# Patient Record
Sex: Male | Born: 1975 | Race: Black or African American | Hispanic: No | Marital: Married | Smoking: Former smoker
Health system: Southern US, Community
[De-identification: ages and names within clinical notes are randomized; demographics above are authoritative.]

## PROBLEM LIST (undated history)

## (undated) ENCOUNTER — Ambulatory Visit (HOSPITAL_COMMUNITY): Admission: EM | Payer: Self-pay

## (undated) ENCOUNTER — Ambulatory Visit (HOSPITAL_COMMUNITY): Admission: EM | Payer: Self-pay | Source: Home / Self Care

## (undated) DIAGNOSIS — E785 Hyperlipidemia, unspecified: Secondary | ICD-10-CM

## (undated) DIAGNOSIS — I10 Essential (primary) hypertension: Secondary | ICD-10-CM

## (undated) HISTORY — DX: Hyperlipidemia, unspecified: E78.5

---

## 2008-06-13 DIAGNOSIS — I10 Essential (primary) hypertension: Secondary | ICD-10-CM

## 2008-06-13 DIAGNOSIS — E785 Hyperlipidemia, unspecified: Secondary | ICD-10-CM | POA: Insufficient documentation

## 2008-06-13 DIAGNOSIS — E1142 Type 2 diabetes mellitus with diabetic polyneuropathy: Secondary | ICD-10-CM | POA: Insufficient documentation

## 2008-06-13 HISTORY — DX: Essential (primary) hypertension: I10

## 2011-10-13 ENCOUNTER — Emergency Department (HOSPITAL_COMMUNITY)
Admission: EM | Admit: 2011-10-13 | Discharge: 2011-10-13 | Disposition: A | Discharge status: Home / Self Care | Payer: Self-pay | Attending: Emergency Medicine | Admitting: Emergency Medicine

## 2011-10-13 ENCOUNTER — Encounter (HOSPITAL_COMMUNITY): Payer: Self-pay | Admitting: Emergency Medicine

## 2011-10-13 DIAGNOSIS — R599 Enlarged lymph nodes, unspecified: Secondary | ICD-10-CM | POA: Insufficient documentation

## 2011-10-13 DIAGNOSIS — F17200 Nicotine dependence, unspecified, uncomplicated: Secondary | ICD-10-CM | POA: Insufficient documentation

## 2011-10-13 DIAGNOSIS — E119 Type 2 diabetes mellitus without complications: Secondary | ICD-10-CM | POA: Insufficient documentation

## 2011-10-13 DIAGNOSIS — I10 Essential (primary) hypertension: Secondary | ICD-10-CM | POA: Insufficient documentation

## 2011-10-13 DIAGNOSIS — R591 Generalized enlarged lymph nodes: Secondary | ICD-10-CM

## 2011-10-13 HISTORY — DX: Essential (primary) hypertension: I10

## 2011-10-13 MED ORDER — CEPHALEXIN 500 MG PO CAPS: 500.0000 mg | ORAL_CAPSULE | Freq: Four times a day (QID) | ORAL | Status: AC

## 2011-10-13 NOTE — Discharge Instructions (Signed)
Lymphadenopathy Lymphadenopathy means "disease of the lymph glands." But the term is usually used to describe swollen or enlarged lymph glands, also called lymph nodes. These are the bean-shaped organs found in many locations including the neck, underarm, and groin. Lymph glands are part of the immune system, which fights infections in your body. Lymphadenopathy can occur in just one area of the body, such as the neck, or it can be generalized, with lymph node enlargement in several areas. The nodes found in the neck are the most common sites of lymphadenopathy. CAUSES  When your immune system responds to germs (such as viruses or bacteria ), infection-fighting cells and fluid build up. This causes the glands to grow in size. This is usually not something to worry about. Sometimes, the glands themselves can become infected and inflamed. This is called lymphadenitis. Enlarged lymph nodes can be caused by many diseases:  Bacterial disease, such as strep throat or a skin infection.   Viral disease, such as a common cold.   Other germs, such as lyme disease, tuberculosis, or sexually transmitted diseases.   Cancers, such as lymphoma (cancer of the lymphatic system) or leukemia (cancer of the white blood cells).   Inflammatory diseases such as lupus or rheumatoid arthritis.   Reactions to medications.  Many of the diseases above are rare, but important. This is why you should see your caregiver if you have lymphadenopathy. SYMPTOMS   Swollen, enlarged lumps in the neck, back of the head or other locations.   Tenderness.   Warmth or redness of the skin over the lymph nodes.   Fever.  DIAGNOSIS  Enlarged lymph nodes are often near the source of infection. They can help healthcare providers diagnose your illness. For instance:   Swollen lymph nodes around the jaw might be caused by an infection in the mouth.   Enlarged glands in the neck often signal a throat infection.   Lymph nodes that  are swollen in more than one area often indicate an illness caused by a virus.  Your caregiver most likely will know what is causing your lymphadenopathy after listening to your history and examining you. Blood tests, x-rays or other tests may be needed. If the cause of the enlarged lymph node cannot be found, and it does not go away by itself, then a biopsy may be needed. Your caregiver will discuss this with you. TREATMENT  Treatment for your enlarged lymph nodes will depend on the cause. Many times the nodes will shrink to normal size by themselves, with no treatment. Antibiotics or other medicines may be needed for infection. Only take over-the-counter or prescription medicines for pain, discomfort or fever as directed by your caregiver. HOME CARE INSTRUCTIONS  Swollen lymph glands usually return to normal when the underlying medical condition goes away. If they persist, contact your health-care provider. He/she might prescribe antibiotics or other treatments, depending on the diagnosis. Take any medications exactly as prescribed. Keep any follow-up appointments made to check on the condition of your enlarged nodes.  SEEK MEDICAL CARE IF:   Swelling lasts for more than two weeks.   You have symptoms such as weight loss, night sweats, fatigue or fever that does not go away.   The lymph nodes are hard, seem fixed to the skin or are growing rapidly.   Skin over the lymph nodes is red and inflamed. This could mean there is an infection.  SEEK IMMEDIATE MEDICAL CARE IF:   Fluid starts leaking from the area of the   enlarged lymph node.   You develop a fever of 102 F (38.9 C) or greater.   Severe pain develops (not necessarily at the site of a large lymph node).   You develop chest pain or shortness of breath.   You develop worsening abdominal pain.  MAKE SURE YOU:   Understand these instructions.   Will watch your condition.   Will get help right away if you are not doing well or get  worse.  Document Released: 03/08/2008 Document Revised: 05/19/2011 Document Reviewed: 03/08/2008 ExitCare Patient Information 2012 [employer]. 

## 2011-10-13 NOTE — ED Notes (Signed)
Pt sts hard knot in right groin area x 2 days; pt sts painful to palpation

## 2011-10-13 NOTE — ED Provider Notes (Addendum)
History  Scribed for Hilario Quarry, MD, the patient was seen in room STRE7/STRE7. This chart was scribed by Candelaria Stagers. The patient's care started at 11:37AM    CSN: [id number]  Arrival date & time 10/13/11  4098   First MD Initiated Contact with Patient 10/13/11 1111      Chief Complaint  Patient presents with  . Abscess     HPI Adam Moyer is a 36 y.o. male who presents to the Emergency Department complaining of of a knot in his right groin that he noticed two days ago.  Pt states that the knot is not red or hot nor has it changed size.  He denies fever, chills, diarrhea, dysuria, or increased frequency.  He has h/o diabetes and HTN.   Past Medical History  Diagnosis Date  . Hypertension   . Diabetes mellitus     History reviewed. No pertinent past surgical history.  History reviewed. No pertinent family history.  History  Substance Use Topics  . Smoking status: Current Everyday Smoker  . Smokeless tobacco: Not on file  . Alcohol Use: Yes     occasional      Review of Systems  Gastrointestinal: Negative for nausea, vomiting and diarrhea.  Genitourinary: Negative for dysuria.       Non tender well defined knot in the right groin area, 2x3cm in size.    All other systems reviewed and are negative.    Allergies  Review of patient's allergies indicates no known allergies.  Home Medications   Current Outpatient Rx  Name Route Sig Dispense Refill  . LISINOPRIL-HYDROCHLOROTHIAZIDE 20-12.5 MG PO TABS Oral Take 1 tablet by mouth daily.    Marland Kitchen METFORMIN HCL 500 MG PO TABS Oral Take 500 mg by mouth 2 (two) times daily with a meal.      BP 121/68  Pulse 82  Temp(Src) 97.9 F (36.6 C) (Oral)  Resp 20  SpO2 98%  Physical Exam  Nursing note and vitals reviewed. Constitutional: He is oriented to person, place, and time. He appears well-developed and well-nourished. No distress.  HENT:  Head: Normocephalic and atraumatic.  Genitourinary:       Non tender  well defined knot in the right groin area, 2cmx3cm in size.   Neurological: He is alert and oriented to person, place, and time.  Skin: He is not diaphoretic.  Psychiatric: He has a normal mood and affect. His behavior is normal.    ED Course  Procedures  DIAGNOSTIC STUDIES: Oxygen Saturation is 98% on room air, normal by my interpretation.    COORDINATION OF CARE:   I personally performed the services described in this documentation, which was scribed in my presence. The recorded information has been reviewed and considered.   Labs Reviewed - No data to display No results found.   No diagnosis found.    Patient placed on keflex and advised of need for close follow up Patient given lymph node swelling information and referral to [facility].         Hilario Quarry, MD 10/13/11 1191  Hilario Quarry, MD 10/13/11 [phone]

## 2011-11-11 ENCOUNTER — Emergency Department (HOSPITAL_COMMUNITY)
Admission: EM | Admit: 2011-11-11 | Discharge: 2011-11-11 | Disposition: A | Discharge status: Home / Self Care | Payer: Self-pay | Attending: Emergency Medicine | Admitting: Emergency Medicine

## 2011-11-11 ENCOUNTER — Encounter (HOSPITAL_COMMUNITY): Payer: Self-pay | Admitting: Emergency Medicine

## 2011-11-11 DIAGNOSIS — I10 Essential (primary) hypertension: Secondary | ICD-10-CM | POA: Insufficient documentation

## 2011-11-11 DIAGNOSIS — E119 Type 2 diabetes mellitus without complications: Secondary | ICD-10-CM | POA: Insufficient documentation

## 2011-11-11 DIAGNOSIS — Z760 Encounter for issue of repeat prescription: Secondary | ICD-10-CM | POA: Insufficient documentation

## 2011-11-11 DIAGNOSIS — F17200 Nicotine dependence, unspecified, uncomplicated: Secondary | ICD-10-CM | POA: Insufficient documentation

## 2011-11-11 LAB — GLUCOSE, CAPILLARY: Glucose-Capillary: 241 mg/dL — ABNORMAL HIGH (ref 70–99)

## 2011-11-11 MED ORDER — LISINOPRIL-HYDROCHLOROTHIAZIDE 20-12.5 MG PO TABS: 1.0000 | ORAL_TABLET | Freq: Every day | ORAL | Status: DC

## 2011-11-11 MED ORDER — METFORMIN HCL 500 MG PO TABS: 500.0000 mg | ORAL_TABLET | Freq: Two times a day (BID) | ORAL | Status: DC

## 2011-11-11 NOTE — Discharge Instructions (Signed)
Medication Refill, Emergency Department We have refilled your medication today as a courtesy to you. It is best for your medical care, however, to take care of getting refills done through your primary caregiver's office. They have your records and can do a better job of follow-up than we can in the emergency department. On maintenance medications, we often only prescribe enough medications to get you by until you are able to see your regular caregiver. This is a more expensive way to refill medications. In the future, please plan for refills so that you will not have to use the emergency department for this. Thank you for your help. Your help allows [place] to better take care of the daily emergencies that enter our department. Document Released: 09/16/2003 Document Revised: 05/19/2011 Document Reviewed: 05/30/2005 [facility] Patient Information 2012 [place], [place].RESOURCE GUIDE  Chronic Pain Problems: Contact [facility]  [phone] Patients need to be referred by their primary care doctor.  Insufficient Money for Medicine: Contact United Way:  call "211" or [facility] [phone].  No Primary Care Doctor: - Call [facility]  [phone] - can help you locate a primary care doctor that  accepts your insurance, provides certain services, etc. - Physician Referral Service- [phone]  Agencies that provide inexpensive medical care: - [facility]  [phone] - [facility]  [phone] - [facility]  [phone] - Women's Clinic  [phone] - Planned Parenthood  [phone] [facility]  [phone]  Medicaid-accepting [facility] Providers: - [facility]- [address]  [phone], Mon-Fri 9am-7pm, Sat 9am-1pm - [facility]- [address] [place], [address] [place]  [phone] - [facility]- [address], [address] [place]  [phone] [facility]  Family Medicine- [address]  [phone] - Renaye Rakers- [address] [place], [address], [phone]  Only accepts [place] patients after they have their name  applied to their card  Self Pay (no insurance) in [place]: - Sickle Cell Patients: Dr Willey Blade, [facility]  [address] [place], [phone] - [facility]- [address] [place]  [phone]       [facility] [place]- [address]       -     [facility]- see information above (Speak to [employer] if you do not have insurance)       -  [facility]- [address] [place], [phone]       -  [facility]- [address],  [phone]       -  [facility]- [address] [phone]       -  Dr Julio Sicks-  [address], [place], [phone]       -  [facility]- [address], [phone]       -  [facility]- [address] [phone], also [address] [phone]       -    [facility]- [address] [place], [phone], 1st & 3rd Saturday   every month, 10am-1pm  1) Find a Doctor and Pay Out of Pocket Although you won't have to find out who is covered by your insurance plan, it is a good idea to ask around and get recommendations.  You will then need to call the office and see if the doctor you have chosen will accept you as a new patient and what types of options they offer for patients who are self-pay. Some doctors offer discounts or will set up payment plans for their patients who do not have insurance, but you will need to ask so you aren't surprised when you get to your appointment.  2) Contact Your Local Health Department Not all health departments have doctors that can see patients for sick visits, but many do, so it is worth a call to see if yours does. If you don't know where your local health department is, you can check in your phone book. The CDC also has a  tool to help you locate your state's health department, and many state websites also have listings of all of their local health departments.  3) Find a Walk-in Clinic If your illness is not likely to be very severe or complicated, you may want to try a walk in clinic. These are popping up all over the country in pharmacies, drugstores, and shopping centers. They're usually staffed by nurse practitioners or physician assistants that have been trained to treat common illnesses and complaints. They're usually fairly quick and inexpensive. However, if you have serious medical issues or chronic medical problems, these are probably not your best option  STD Testing - [facility] Department of [facility], STD Clinic, [address] [place], phone [phone] or [phone].  Monday - Friday, call for an appointment. [facility] Department of [employer], STD Clinic, [place] [address], [place], phone [phone] or [phone].  Monday - Friday, call for an appointment.  Abuse/Neglect: [facility] Child Abuse Hotline 563-791-2658 [facility] Child Abuse Hotline 670-128-3148 (After Hours)  Emergency Shelter:  [facility] [phone]  Maternity Homes: - [facility] 701-504-8813 - [facility] (760)[phone]  MRSA Hotline #:   (301)603-7665  [facility] Resources  [facility]  United Way [facility] Dept. [address]                 [address]         [address] [place] [address]  Blondell Reveal Phone:  [phone]                                  Phone:  [phone]                   Phone:  [phone]  [facility], [phone] - [phone]       -     [facility] in  [employer], [address],  [phone], [facility] Child Abuse Hotline (726) 807-5085 or (859)134-5397 (After Hours)   Behavioral Health Services  Substance Abuse Resources: - Alcohol and Drug Services  662-551-1668 - [facility] [phone] - [facility] [phone] Floydene Flock 6693073472 - Residential & Outpatient Substance Abuse Program  [phone]  Psychological Services: [facility]  [phone] [facility] [place] [facility] Services  [phone] - [facility], [phone] [place]. [address] [place], ACCESS LINE: [phone] or [phone], [email or web address]  Dental Assistance  If unable to pay or uninsured, contact:  [facility] or [facility]. to become qualified for the adult dental clinic.  Patients with Medicaid: [facility] [phone] [address], [phone] [address] [phone]  If unable to pay, or uninsured, contact [facility] (646)[phone]) or [facility] Department [phone] in [place], [phone] in [facility]) to become qualified for the adult dental clinic  Other Low-Cost Community Dental Services: - [facility]- [address] [place], [place], [place], [address], [phone], [phone], 2nd and 4th Thursday of the month at 6:30am.  10 clients each day by appointment, can sometimes see walk-in patients if someone does not show for an appointment. [facility]- [address] [place], [place], [address], [phone] - [facility]- [address] [place], [place], [address], [phone] - [facility]- [phone] [facility] Health Department- [phone] [facility] [phone] -

## 2011-11-11 NOTE — ED Provider Notes (Signed)
History     CSN: [id number]  Arrival date & time 11/11/11  0920   First MD Initiated Contact with Patient 11/11/11 [phone]    HPI The patient presents to the emergency room with complaints of medication refill. Patient has history of diabetes and high blood pressure. He states he ran out of his medications and does not have a primary care Dr. Patient denies any other complaints. He was previously seen in the emergency room for some swelling in his groin area but that has resolved without filling his prescriptions. Patient denies any vomiting, fever, chest pain, polydipsia or polyuria.  Past Medical History  Diagnosis Date  . Hypertension   . Diabetes mellitus     History reviewed. No pertinent past surgical history.  No family history on file.  History  Substance Use Topics  . Smoking status: Current Everyday Smoker  . Smokeless tobacco: Not on file  . Alcohol Use: Yes     occasional      Review of Systems  All other systems reviewed and are negative.    Allergies  Review of patient's allergies indicates no known allergies.  Home Medications   Current Outpatient Rx  Name Route Sig Dispense Refill  . LISINOPRIL-HYDROCHLOROTHIAZIDE 20-12.5 MG PO TABS Oral Take 1 tablet by mouth daily.    Marland Kitchen METFORMIN HCL 500 MG PO TABS Oral Take 500 mg by mouth 2 (two) times daily with a meal.      BP 132/66  Pulse 91  Temp(Src) 98.6 F (37 C) (Oral)  Resp 18  SpO2 100%  Physical Exam  Nursing note and vitals reviewed. Constitutional: He appears well-developed and well-nourished. No distress.  HENT:  Head: Normocephalic and atraumatic.  Right Ear: External ear normal.  Left Ear: External ear normal.  Eyes: Conjunctivae are normal. Right eye exhibits no discharge. Left eye exhibits no discharge. No scleral icterus.  Neck: Neck supple. No tracheal deviation present.  Cardiovascular: Normal rate, regular rhythm and intact distal pulses.   Pulmonary/Chest: Effort normal and breath  sounds normal. No stridor. No respiratory distress. He has no wheezes. He has no rales.  Abdominal: He exhibits distension.  Musculoskeletal: He exhibits no edema.  Neurological: He is alert. He has normal strength. No sensory deficit. Cranial nerve deficit:  no gross defecits noted. He exhibits normal muscle tone. He displays no seizure activity. Coordination normal.  Skin: Skin is warm and dry. No rash noted.  Psychiatric: He has a normal mood and affect.    ED Course  Procedures (including critical care time)  Labs Reviewed - No data to display No results found.   1. Encounter for medication refill       MDM  I prescribed refills of the patient's blood pressure and diabetes medications. He was given the resource guide that provided him a list of primary care doctors in the area.  Next point patient the importance of seeing a primary care doctor to manage his blood pressure and diabetes. At this time there does not appear to be any evidence of an acute emergency medical condition and the patient appears stable for discharge with appropriate outpatient follow up.        Celene Kras, MD 11/11/11 1002

## 2011-11-11 NOTE — ED Notes (Signed)
Pt reports he is out of blood pressure and diabetes meds and needs new prescriptions. CBG 241 in triage.

## 2012-12-13 ENCOUNTER — Emergency Department (HOSPITAL_COMMUNITY)
Admission: EM | Admit: 2012-12-13 | Discharge: 2012-12-13 | Disposition: A | Discharge status: Home / Self Care | Payer: Self-pay | Attending: Emergency Medicine | Admitting: Emergency Medicine

## 2012-12-13 ENCOUNTER — Encounter (HOSPITAL_COMMUNITY): Payer: Self-pay | Admitting: *Deleted

## 2012-12-13 DIAGNOSIS — M62838 Other muscle spasm: Secondary | ICD-10-CM | POA: Insufficient documentation

## 2012-12-13 DIAGNOSIS — F17200 Nicotine dependence, unspecified, uncomplicated: Secondary | ICD-10-CM | POA: Insufficient documentation

## 2012-12-13 DIAGNOSIS — M542 Cervicalgia: Secondary | ICD-10-CM

## 2012-12-13 DIAGNOSIS — E119 Type 2 diabetes mellitus without complications: Secondary | ICD-10-CM | POA: Insufficient documentation

## 2012-12-13 DIAGNOSIS — I10 Essential (primary) hypertension: Secondary | ICD-10-CM | POA: Insufficient documentation

## 2012-12-13 DIAGNOSIS — M436 Torticollis: Secondary | ICD-10-CM | POA: Insufficient documentation

## 2012-12-13 DIAGNOSIS — Z79899 Other long term (current) drug therapy: Secondary | ICD-10-CM | POA: Insufficient documentation

## 2012-12-13 MED ORDER — CYCLOBENZAPRINE HCL 10 MG PO TABS
10.0000 mg | ORAL_TABLET | Freq: Once | ORAL | Status: AC
  Administered 2012-12-13: 10 mg via ORAL
  Filled 2012-12-13: qty 1

## 2012-12-13 MED ORDER — PREDNISONE 20 MG PO TABS: 40.0000 mg | ORAL_TABLET | Freq: Every day | ORAL | Status: DC

## 2012-12-13 MED ORDER — CYCLOBENZAPRINE HCL 10 MG PO TABS: 10.0000 mg | ORAL_TABLET | Freq: Two times a day (BID) | ORAL | Status: DC | PRN

## 2012-12-13 MED ORDER — PREDNISONE 20 MG PO TABS
60.0000 mg | ORAL_TABLET | Freq: Once | ORAL | Status: AC
  Administered 2012-12-13: 60 mg via ORAL
  Filled 2012-12-13: qty 3

## 2012-12-13 NOTE — ED Notes (Signed)
Pt reports 1 week hx of neck stiffness and pain radiating to right shoulder. Reports intermittent numbness and tingling. Pt alert, oriented x4, NAD at present.

## 2012-12-13 NOTE — ED Provider Notes (Signed)
History    CSN: [id number] Arrival date & time 12/13/12  0856  First MD Initiated Contact with Patient 12/13/12 0901     Chief Complaint  Patient presents with  . Torticollis  . Shoulder Pain   (Consider location/radiation/quality/duration/timing/severity/associated sxs/prior Treatment) HPI  Patient is a 37 year old male past medical history significant for hypertension and diabetes mellitus presented to the emergency department for one week of worsening right-sided neck pain with intermittent radiation down the right arm. Patient describes his pain as tight and cramping. Patient states he gets a sharp shooting pain with some numbness and tingling down the arm with painful. Rates his pain currently at 7/10. Patient states he has tried icy hot and NSAIDs without relief. Aggravating factors include turning his head. Patient denies any trauma or injury to his neck or shoulder. Denies any fever or chills.   Past Medical History  Diagnosis Date  . Hypertension   . Diabetes mellitus    History reviewed. No pertinent past surgical history. No family history on file. History  Substance Use Topics  . Smoking status: Current Every Day Smoker  . Smokeless tobacco: Not on file  . Alcohol Use: Yes     Comment: occasional    Review of Systems  Constitutional: Negative for fever and chills.  HENT: Positive for neck pain and neck stiffness. Negative for ear pain and facial swelling.   Respiratory: Negative for shortness of breath.   Cardiovascular: Negative for chest pain.  Gastrointestinal: Negative for nausea, vomiting and diarrhea.  Neurological: Negative for headaches.  All other systems reviewed and are negative.    Allergies  Review of patient's allergies indicates no known allergies.  Home Medications   Current Outpatient Rx  Name  Route  Sig  Dispense  Refill  . cyclobenzaprine (FLEXERIL) 10 MG tablet   Oral   Take 1 tablet (10 mg total) by mouth 2 (two) times daily as  needed for muscle spasms.   20 tablet   0   . lisinopril-hydrochlorothiazide (PRINZIDE,ZESTORETIC) 20-12.5 MG per tablet   Oral   Take 1 tablet by mouth daily.   30 tablet   1   . metFORMIN (GLUCOPHAGE) 500 MG tablet   Oral   Take 1 tablet (500 mg total) by mouth 2 (two) times daily with a meal.   60 tablet   1   . predniSONE (DELTASONE) 20 MG tablet   Oral   Take 2 tablets (40 mg total) by mouth daily.   10 tablet   0    BP 130/73  Pulse 78  Temp(Src) 98.1 F (36.7 C) (Oral)  Resp 16  SpO2 100% Physical Exam  Constitutional: He is oriented to person, place, and time. He appears well-developed and well-nourished. No distress.  HENT:  Head: Normocephalic and atraumatic.  Eyes: Conjunctivae are normal.  Neck: Trachea normal and normal range of motion. Neck supple. No spinous process tenderness present. No rigidity. No edema and no erythema present.  Musculoskeletal:       Right shoulder: He exhibits tenderness and spasm. He exhibits normal range of motion, no bony tenderness, no swelling, no effusion, no crepitus, no deformity, no laceration, no pain, normal pulse and normal strength.       Left shoulder: Normal.       Right elbow: Normal.      Left elbow: Normal.       Right wrist: Normal.       Left wrist: Normal.  Cervical back: He exhibits tenderness and spasm. He exhibits no bony tenderness, no swelling, no edema and no deformity.       Back:       Right upper arm: Normal.       Right hand: Normal.  Full ROM during Apley scratch test  Neurological: He is alert and oriented to person, place, and time. No cranial nerve deficit or sensory deficit. GCS eye subscore is 4. GCS verbal subscore is 5. GCS motor subscore is 6.  Skin: Skin is warm and dry. He is not diaphoretic.  Psychiatric: He has a normal mood and affect.    ED Course  Procedures (including critical care time) Labs Reviewed - No data to display No results found. 1. Neck pain on right side     2. Muscle spasm     MDM  PE shows no instability, tenderness, or deformity of acromioclavicular and sternoclavicular joints, the cervical spine, glenohumeral joint, coracoid process, acromion, or scapula. Good shoulder strength. Good ROM during scratch test. No signs of impingement. No C-spine tenderness. Full range motion in. Muscle spasm appreciated on right side of shoulder. Pain reproducible with palpation. Neurovascularly intact. Patient will be DC'd home with symptomatic care. Advised to obtain PCP for followup. Return precautions discussed. Patient stable at time of discharge.  Jeannetta Ellis, PA-C 12/13/12 1208

## 2012-12-14 NOTE — ED Provider Notes (Signed)
Medical screening examination/treatment/procedure(s) were performed by non-physician practitioner and as supervising physician I was immediately available for consultation/collaboration.    Christopher J. Pollina, MD 12/14/12 2303 

## 2013-05-15 ENCOUNTER — Encounter (HOSPITAL_COMMUNITY): Payer: Self-pay | Admitting: Emergency Medicine

## 2013-05-15 ENCOUNTER — Emergency Department (HOSPITAL_COMMUNITY)
Admission: EM | Admit: 2013-05-15 | Discharge: 2013-05-15 | Disposition: A | Discharge status: Home / Self Care | Payer: Self-pay | Attending: Emergency Medicine | Admitting: Emergency Medicine

## 2013-05-15 DIAGNOSIS — R51 Headache: Secondary | ICD-10-CM | POA: Insufficient documentation

## 2013-05-15 DIAGNOSIS — Z87891 Personal history of nicotine dependence: Secondary | ICD-10-CM | POA: Insufficient documentation

## 2013-05-15 DIAGNOSIS — E119 Type 2 diabetes mellitus without complications: Secondary | ICD-10-CM | POA: Insufficient documentation

## 2013-05-15 DIAGNOSIS — R5381 Other malaise: Secondary | ICD-10-CM | POA: Insufficient documentation

## 2013-05-15 DIAGNOSIS — R42 Dizziness and giddiness: Secondary | ICD-10-CM | POA: Insufficient documentation

## 2013-05-15 DIAGNOSIS — I10 Essential (primary) hypertension: Secondary | ICD-10-CM | POA: Insufficient documentation

## 2013-05-15 DIAGNOSIS — Z79899 Other long term (current) drug therapy: Secondary | ICD-10-CM | POA: Insufficient documentation

## 2013-05-15 DIAGNOSIS — R61 Generalized hyperhidrosis: Secondary | ICD-10-CM | POA: Insufficient documentation

## 2013-05-15 DIAGNOSIS — R509 Fever, unspecified: Secondary | ICD-10-CM | POA: Insufficient documentation

## 2013-05-15 LAB — BASIC METABOLIC PANEL
BUN: 13 mg/dL (ref 6–23)
CO2: 25 mEq/L (ref 19–32)
Chloride: 100 mEq/L (ref 96–112)
Glucose, Bld: 127 mg/dL — ABNORMAL HIGH (ref 70–99)
Potassium: 3.8 mEq/L (ref 3.5–5.1)

## 2013-05-15 LAB — CBC WITH DIFFERENTIAL/PLATELET
HCT: 41.4 % (ref 39.0–52.0)
Hemoglobin: 13.9 g/dL (ref 13.0–17.0)
Lymphs Abs: 2.5 10*3/uL (ref 0.7–4.0)
MCH: 26.4 pg (ref 26.0–34.0)
Monocytes Relative: 6 % (ref 3–12)
Neutro Abs: 4.8 10*3/uL (ref 1.7–7.7)
Neutrophils Relative %: 60 % (ref 43–77)
RBC: 5.26 MIL/uL (ref 4.22–5.81)

## 2013-05-15 LAB — GLUCOSE, CAPILLARY: Glucose-Capillary: 138 mg/dL — ABNORMAL HIGH (ref 70–99)

## 2013-05-15 MED ORDER — SODIUM CHLORIDE 0.9 % IV BOLUS (SEPSIS)
1000.0000 mL | Freq: Once | INTRAVENOUS | Status: AC
  Administered 2013-05-15: 1000 mL via INTRAVENOUS

## 2013-05-15 MED ORDER — ONDANSETRON HCL 4 MG/2ML IJ SOLN
4.0000 mg | Freq: Once | INTRAMUSCULAR | Status: AC
  Administered 2013-05-15: 4 mg via INTRAVENOUS
  Filled 2013-05-15: qty 2

## 2013-05-15 MED ORDER — PROMETHAZINE HCL 25 MG RE SUPP: 25.0000 mg | Freq: Four times a day (QID) | RECTAL | Status: DC | PRN

## 2013-05-15 MED ORDER — BUTALBITAL-APAP-CAFFEINE 50-325-40 MG PO TABS: 1.0000 | ORAL_TABLET | Freq: Four times a day (QID) | ORAL | Status: DC | PRN

## 2013-05-15 MED ORDER — ONDANSETRON HCL 4 MG PO TABS: 4.0000 mg | ORAL_TABLET | Freq: Four times a day (QID) | ORAL | Status: DC

## 2013-05-15 MED ORDER — KETOROLAC TROMETHAMINE 30 MG/ML IJ SOLN
30.0000 mg | Freq: Once | INTRAMUSCULAR | Status: AC
  Administered 2013-05-15: 30 mg via INTRAVENOUS
  Filled 2013-05-15: qty 1

## 2013-05-15 MED ORDER — PROMETHAZINE HCL 25 MG/ML IJ SOLN: 12.5000 mg | Freq: Once | INTRAMUSCULAR | Status: DC

## 2013-05-15 NOTE — ED Notes (Signed)
Pt states that he woke up this morning with headache and dizziness. Pt also states that he been sweating since around 2am. Pt took his blood sugar around 1000 was 186 then 193. Pt took his metformin and lisinopril around 8am. Pt states he ate a sausage biscuit this morning. Pt denies n/v or fever.

## 2013-05-15 NOTE — ED Provider Notes (Signed)
Medical screening examination/treatment/procedure(s) were conducted as a shared visit with non-physician practitioner(s) or resident  and myself.  I personally evaluated the patient during the encounter and agree with the findings and plan unless otherwise indicated.    I have personally reviewed any xrays and/ or EKG's with the provider and I agree with interpretation.   HA, body aches, subjective fever and lightheaded since this am. Minimal po intake today.  HA similar to previous, gradually worsening.  No travel recently.  No sick contacts.  No cp or sob.  Sxs resolved on my exam. No cardiac hx.  Mild nausea.  Fluids and labs in ED.  5+ strength in UE and LE with f/e at major joints. Fup outpt discussed.  Sensation to palpation intact in UE and LE. Supple neck/ full rom, no meningismus. CNs 2-12 grossly intact.  EOMFI.  PERRL.   Finger nose and coordination intact bilateral.   Visual fields intact to finger testing. Mild dry mm.  Smiling, well appearing on recheck. Strict reasons to return given.  HA, lightheaded, hyperglycemia, htn  Enid Skeens, MD 05/19/13 1043

## 2013-05-15 NOTE — ED Provider Notes (Signed)
CSN: [id number]     Arrival date & time 05/15/13  1258 History   None    Chief Complaint  Patient presents with  . Headache  . Dizziness  . Excessive Sweating   (Consider location/radiation/quality/duration/timing/severity/associated sxs/prior Treatment) HPI Comments: The patient is a 37 year-old male with a past medical history of DM, HTN, presenting the Emergency Department with a chief complaint of headache.  He reports waking up with a headache.  He reports associated lightheadedness without syncope.  He reports  the lightheadedness is aggravated by standing.  He reports subjective fever last night with sweating.  He reports he has only had one cup of coffee today for total oral intake.   He states his home blood glucose was 190 earlier today and is usually is around 120. He reports no change in blood pressure and monitors it at home. He reports compliance with HTN and DM medications.   Patient is a 37 y.o. male presenting with headaches. The history is provided by the patient and a significant other. No language interpreter was used.  Headache Associated symptoms: fatigue, fever and nausea   Associated symptoms: no abdominal pain, no congestion, no cough, no diarrhea, no ear pain, no hearing loss, no neck pain, no neck stiffness and no vomiting     Past Medical History  Diagnosis Date  . Hypertension   . Diabetes mellitus    History reviewed. No pertinent past surgical history. No family history on file. History  Substance Use Topics  . Smoking status: Former Smoker    Quit date: 10/11/2012  . Smokeless tobacco: Not on file  . Alcohol Use: Yes     Comment: occasional    Review of Systems  Constitutional: Positive for fever, chills, diaphoresis and fatigue. Negative for appetite change and unexpected weight change.  HENT: Negative for congestion, ear pain, hearing loss and rhinorrhea.   Eyes: Negative for visual disturbance.  Respiratory: Negative for cough, shortness of  breath and wheezing.   Cardiovascular: Negative for chest pain.  Gastrointestinal: Positive for nausea. Negative for vomiting, abdominal pain, diarrhea, constipation and blood in stool.  Genitourinary: Negative for dysuria, hematuria and difficulty urinating.  Musculoskeletal: Negative for neck pain and neck stiffness.  Skin: Negative for rash.  Neurological: Positive for light-headedness and headaches. Negative for syncope and facial asymmetry.  All other systems reviewed and are negative.    Allergies  Review of patient's allergies indicates no known allergies.  Home Medications   Current Outpatient Rx  Name  Route  Sig  Dispense  Refill  . hydrochlorothiazide (HYDRODIURIL) 25 MG tablet   Oral   Take 25 mg by mouth daily.         Marland Kitchen lisinopril-hydrochlorothiazide (PRINZIDE,ZESTORETIC) 20-12.5 MG per tablet   Oral   Take 1 tablet by mouth daily.   30 tablet   1   . metFORMIN (GLUCOPHAGE) 500 MG tablet   Oral   Take 1 tablet (500 mg total) by mouth 2 (two) times daily with a meal.   60 tablet   1   . butalbital-acetaminophen-caffeine (FIORICET) 50-325-40 MG per tablet   Oral   Take 1-2 tablets by mouth every 6 (six) hours as needed for headache.   20 tablet   0   . ondansetron (ZOFRAN) 4 MG tablet   Oral   Take 1 tablet (4 mg total) by mouth every 6 (six) hours.   12 tablet   0   . promethazine (PHENERGAN) 25 MG suppository  Rectal   Place 1 suppository (25 mg total) rectally every 6 (six) hours as needed for nausea or vomiting.   12 each   0    BP 123/75  Pulse 72  Temp(Src) 97.7 F (36.5 C) (Oral)  Resp 18  SpO2 99% Physical Exam  Nursing note and vitals reviewed. Constitutional: He is oriented to person, place, and time. Vital signs are normal. He appears well-developed and well-nourished. He is active. No distress.  HENT:  Head: Normocephalic and atraumatic.  Right Ear: Tympanic membrane normal. Tympanic membrane is not injected and not bulging.   Left Ear: Tympanic membrane normal. Tympanic membrane is not injected and not bulging.  Mouth/Throat: Uvula is midline and mucous membranes are normal. Mucous membranes are not dry. Posterior oropharyngeal erythema present. No oropharyngeal exudate or posterior oropharyngeal edema.  Eyes: EOM are normal. Pupils are equal, round, and reactive to light. No scleral icterus.  Neck: Neck supple.  Cardiovascular: Normal rate, regular rhythm and normal heart sounds.   No murmur heard. Pulmonary/Chest: Effort normal and breath sounds normal. He has no wheezes.  Abdominal: Soft. Bowel sounds are normal. There is no tenderness. There is no rebound and no guarding.  Musculoskeletal: Normal range of motion. He exhibits no edema.  Lymphadenopathy:       Head (right side): No submental, no submandibular, no tonsillar and no occipital adenopathy present.       Head (left side): No submental, no submandibular, no tonsillar and no occipital adenopathy present.       Right cervical: No superficial cervical and no posterior cervical adenopathy present.      Left cervical: No superficial cervical and no posterior cervical adenopathy present.  Neurological: He is alert and oriented to person, place, and time. He is not disoriented. He displays no tremor. No cranial nerve deficit or sensory deficit. Coordination normal.  Skin: Skin is warm and dry. No rash noted. He is not diaphoretic.  Psychiatric: He has a normal mood and affect.    ED Course  Procedures (including critical care time) Labs Review Labs Reviewed  GLUCOSE, CAPILLARY - Abnormal; Notable for the following:    Glucose-Capillary 138 (*)    All other components within normal limits  BASIC METABOLIC PANEL - Abnormal; Notable for the following:    Sodium 134 (*)    Glucose, Bld 127 (*)    GFR calc non Af Amer 88 (*)    All other components within normal limits  CBC WITH DIFFERENTIAL   Imaging Review No results found.  EKG Interpretation    None       MDM   1. Headache    Patient reports weakness, headache, dizziness and subjective fever.  No meningeal signs on exam.  Possible headache with viral etiology given history will evaluate for other causes of headache. Labs sent, medication ordered, fluids ordered.   Re-eval patient reports symptoms improving and tolerating fluids in the ED. Discussed patient history, condition, and labs with Dr. Gardiner Rhyme who agrees after his evaluation of the patient can be evaluated as an out-pt. Discussed lab results, imaging results, and treatment plan with the patient.  He reports understanding and no other concerns at this time.   Patient is stable for discharge at this time. Meds given in ED:  Medications  sodium chloride 0.9 % bolus 1,000 mL (1,000 mLs Intravenous New Bag/Given 05/15/13 1546)  ketorolac (TORADOL) 30 MG/ML injection 30 mg (30 mg Intravenous Given 05/15/13 1546)  ondansetron (ZOFRAN) injection 4 mg (4  mg Intravenous Given 05/15/13 1546)    Discharge Medication List as of 05/15/2013  4:37 PM    START taking these medications   Details  butalbital-acetaminophen-caffeine (FIORICET) 50-325-40 MG per tablet Take 1-2 tablets by mouth every 6 (six) hours as needed for headache., Starting 05/15/2013, Until Thu 05/15/14, Print    ondansetron (ZOFRAN) 4 MG tablet Take 1 tablet (4 mg total) by mouth every 6 (six) hours., Starting 05/15/2013, Until Discontinued, Print    promethazine (PHENERGAN) 25 MG suppository Place 1 suppository (25 mg total) rectally every 6 (six) hours as needed for nausea or vomiting., Starting 05/15/2013, Until Discontinued, Print               Clabe Seal, PA-C 05/19/13 1428

## 2013-05-15 NOTE — Progress Notes (Signed)
[facility] provided pt with list of primary care resources, a [facility] [employer], information about [facility].

## 2013-05-22 NOTE — ED Provider Notes (Signed)
See my separate note. Adam Moyer   Adam Skeens, MD 05/22/13 2011

## 2013-08-29 ENCOUNTER — Emergency Department (HOSPITAL_COMMUNITY)
Admission: EM | Admit: 2013-08-29 | Discharge: 2013-08-30 | Disposition: A | Discharge status: Home / Self Care | Payer: Self-pay | Attending: Emergency Medicine | Admitting: Emergency Medicine

## 2013-08-29 ENCOUNTER — Encounter (HOSPITAL_COMMUNITY): Payer: Self-pay | Admitting: Emergency Medicine

## 2013-08-29 ENCOUNTER — Other Ambulatory Visit (HOSPITAL_COMMUNITY): Payer: Self-pay

## 2013-08-29 DIAGNOSIS — R509 Fever, unspecified: Secondary | ICD-10-CM | POA: Insufficient documentation

## 2013-08-29 DIAGNOSIS — K047 Periapical abscess without sinus: Secondary | ICD-10-CM | POA: Insufficient documentation

## 2013-08-29 DIAGNOSIS — I10 Essential (primary) hypertension: Secondary | ICD-10-CM | POA: Insufficient documentation

## 2013-08-29 DIAGNOSIS — Z87891 Personal history of nicotine dependence: Secondary | ICD-10-CM | POA: Insufficient documentation

## 2013-08-29 DIAGNOSIS — E119 Type 2 diabetes mellitus without complications: Secondary | ICD-10-CM | POA: Insufficient documentation

## 2013-08-29 DIAGNOSIS — Z79899 Other long term (current) drug therapy: Secondary | ICD-10-CM | POA: Insufficient documentation

## 2013-08-29 DIAGNOSIS — R61 Generalized hyperhidrosis: Secondary | ICD-10-CM | POA: Insufficient documentation

## 2013-08-29 MED ORDER — OXYCODONE-ACETAMINOPHEN 5-325 MG PO TABS
1.0000 | ORAL_TABLET | Freq: Once | ORAL | Status: AC
  Administered 2013-08-29: 1 via ORAL
  Filled 2013-08-29: qty 1

## 2013-08-29 MED ORDER — PENICILLIN V POTASSIUM 500 MG PO TABS
500.0000 mg | ORAL_TABLET | Freq: Once | ORAL | Status: AC
  Administered 2013-08-29: 500 mg via ORAL
  Filled 2013-08-29: qty 1

## 2013-08-29 NOTE — ED Provider Notes (Signed)
CSN: [id number]     Arrival date & time 08/29/13  2230 History   First MD Initiated Contact with Patient 08/29/13 2328     Chief Complaint  Patient presents with  . Dental Pain     (Consider location/radiation/quality/duration/timing/severity/associated sxs/prior Treatment) HPI  38 year old male with history of non-insulin-dependent diabetes, hypertension presents with complaint of dental pain. Patient reports gradual onset of pain to his right lower tooth ongoing for the past 3 days. Describe pain as a sharp achy stabbing sensation, persistent, worsening with chewing, cold air or cold water.  Pain is now radiates from jaw down to neck. He also complaining of subjective fevers, chills, diaphoresis. He is afraid to eat due to the pain and report trouble swallowing. He has tried taking Tylenol, ibuprofen, oral gel, and most over the counter medication with minimal relief. He does not have a dentist and does not have insurance. His pain is currently 10 out of 10. He denies any recent trauma, or rash.    Past Medical History  Diagnosis Date  . Hypertension   . Diabetes mellitus    History reviewed. No pertinent past surgical history. History reviewed. No pertinent family history. History  Substance Use Topics  . Smoking status: Former Smoker    Quit date: 10/11/2012  . Smokeless tobacco: Not on file  . Alcohol Use: Yes     Comment: occasional    Review of Systems  Constitutional: Positive for fever, chills and appetite change.  HENT: Positive for dental problem. Negative for ear pain, mouth sores, sore throat, trouble swallowing and voice change.   Respiratory: Negative for shortness of breath.   Cardiovascular: Negative for chest pain.  Gastrointestinal: Negative for abdominal pain.  Skin: Negative for rash.      Allergies  Review of patient's allergies indicates no known allergies.  Home Medications   Current Outpatient Rx  Name  Route  Sig  Dispense  Refill  .  butalbital-acetaminophen-caffeine (FIORICET) 50-325-40 MG per tablet   Oral   Take 1-2 tablets by mouth every 6 (six) hours as needed for headache.   20 tablet   0   . hydrochlorothiazide (HYDRODIURIL) 25 MG tablet   Oral   Take 25 mg by mouth daily.         Marland Kitchen lisinopril-hydrochlorothiazide (PRINZIDE,ZESTORETIC) 20-12.5 MG per tablet   Oral   Take 1 tablet by mouth daily.   30 tablet   1   . metFORMIN (GLUCOPHAGE) 500 MG tablet   Oral   Take 1 tablet (500 mg total) by mouth 2 (two) times daily with a meal.   60 tablet   1   . ondansetron (ZOFRAN) 4 MG tablet   Oral   Take 1 tablet (4 mg total) by mouth every 6 (six) hours.   12 tablet   0   . promethazine (PHENERGAN) 25 MG suppository   Rectal   Place 1 suppository (25 mg total) rectally every 6 (six) hours as needed for nausea or vomiting.   12 each   0    BP 133/85  Pulse 75  Temp(Src) 99.9 F (37.7 C) (Oral)  Resp 14  SpO2 96% Physical Exam  Constitutional: He appears well-developed and well-nourished. No distress.  HENT:  Head: Atraumatic.  Right Ear: External ear normal.  Left Ear: External ear normal.  Mouth: mild trismus.  signficant dental decay to R lower molar (teeth #32 and #31), ttp.  No obvious abscess amenable for drainage.  No obvious signs of deep  tissue infection.  Tolerates secretion.    Eyes: Conjunctivae are normal.  Neck: Normal range of motion. Neck supple. No tracheal deviation present.  Tenderness to R lateral neck without overlying skin changes, swelling, or obvious abscess.    Cardiovascular: Normal rate and regular rhythm.   Pulmonary/Chest: Effort normal.  Abdominal: Soft. There is no tenderness.  Lymphadenopathy:    He has cervical adenopathy.  Neurological: He is alert.  Skin: No rash noted.  Psychiatric: He has a normal mood and affect.    ED Course  NERVE BLOCK Date/Time: 08/30/2013 1:11 AM Performed by: Fayrene HelperRAN, Bannon Giammarco Authorized by: Fayrene HelperRAN, Hillman Attig Consent: Verbal consent  obtained. Risks and benefits: risks, benefits and alternatives were discussed Consent given by: patient Patient understanding: patient states understanding of the procedure being performed Imaging studies: imaging studies available Patient identity confirmed: verbally with patient and arm band Time out: Immediately prior to procedure a "time out" was called to verify the correct patient, procedure, equipment, support staff and site/side marked as required. Indications: pain relief Body area: face/mouth Nerve: inferior alveolar Laterality: right Patient sedated: no Patient position: sitting Needle gauge: 27 G Location technique: anatomical landmarks Local anesthetic: bupivacaine 0.5% without epinephrine Anesthetic total: 1.8 ml Outcome: pain improved Patient tolerance: Patient tolerated the procedure well with no immediate complications.   (including critical care time)  11:52 PM Pt here with worsening dental pain, with mild trismus and c/o R neck pain.  Likely periapical abscess, however since pt c/o trouble swallowing and having mild trismus will obtain maxillofacial CT to r/o deep tissue infection.  Pain medication and abx given.    12:46 AM Care discussed with oncoming provider and with Dr. Hyacinth MeekerMiller who will dispo pt pending CT result.    12:58 AM Maxillofacial CT with dental disease.  Periapical abscess cannot be excluded.  However, no soft tissue abscess.  Will d/c with pain medication and abx with dental f/u.  Pt request for dental block.  I was able to performed an infra-aveolar block with relief of symptom.      Labs Review Labs Reviewed  I-STAT CREATININE, ED   Imaging Review Ct Maxillofacial W/cm  08/30/2013   CLINICAL DATA:  Dental pain.  EXAM: CT MAXILLOFACIAL WITH CONTRAST  TECHNIQUE: Multidetector CT imaging of the maxillofacial structures was performed with intravenous contrast. Multiplanar CT image reconstructions were also generated. A small metallic BB was  placed on the right temple in order to reliably differentiate right from left.  CONTRAST:  100mL OMNIPAQUE IOHEXOL 300 MG/ML  SOLN  COMPARISON:  None.  FINDINGS: Mild ethmoidal soft tissue thickening is noted. Mucous retention cyst left maxillary sinus. Frontal sphenoid sinuses are clear. Orbits are unremarkable. Nasopharynx is clear. Salivary glands are unremarkable. Base of tongue unremarkable. There until impair friend 0 regions are normal. Visualized laryngeal region is unremarkable. No evidence of abscess. Retropharyngeal space normal. No acute bony abnormality. Multiple lucencies are noted about the lower molars particular on the right. Exit changes are consistent with dental caries. Periapical abscess cannot be excluded. No soft tissue abscess.  IMPRESSION: Dental disease as described above.  No soft tissue abscess.   Electronically Signed   By: Maisie Fushomas  Register   On: 08/30/2013 00:52     EKG Interpretation None      MDM   Final diagnoses:  Periapical abscess    BP 133/85  Pulse 75  Temp(Src) 99.9 F (37.7 C) (Oral)  Resp 14  SpO2 96%  I have reviewed nursing notes and vital signs.  I personally reviewed the imaging tests through PACS system  I reviewed available ER/hospitalization records thought the EMR     Fayrene Helper, PA-C 08/30/13 0059  Fayrene Helper, PA-C 08/30/13 1610

## 2013-08-29 NOTE — ED Notes (Signed)
Pt complains of a possible tooth abcess for three days, he complains of pain all the way in his throat, pt states he feels like he has a fever.

## 2013-08-30 ENCOUNTER — Encounter (HOSPITAL_COMMUNITY): Payer: Self-pay

## 2013-08-30 ENCOUNTER — Emergency Department (HOSPITAL_COMMUNITY): Payer: Self-pay

## 2013-08-30 LAB — I-STAT CREATININE, ED: CREATININE: 1.3 mg/dL (ref 0.50–1.35)

## 2013-08-30 MED ORDER — IOHEXOL 300 MG/ML  SOLN
100.0000 mL | Freq: Once | INTRAMUSCULAR | Status: AC | PRN
  Administered 2013-08-30: 100 mL via INTRAVENOUS

## 2013-08-30 MED ORDER — HYDROCODONE-ACETAMINOPHEN 5-325 MG PO TABS: 1.0000 | ORAL_TABLET | ORAL | Status: DC | PRN

## 2013-08-30 MED ORDER — PENICILLIN V POTASSIUM 500 MG PO TABS: 500.0000 mg | ORAL_TABLET | Freq: Three times a day (TID) | ORAL | Status: DC

## 2013-08-30 NOTE — ED Provider Notes (Signed)
Medical screening examination/treatment/procedure(s) were performed by non-physician practitioner and as supervising physician I was immediately available for consultation/collaboration.    Jencarlos Nicolson D Jhada Risk, MD 08/30/13 0700 

## 2013-08-30 NOTE — Discharge Instructions (Signed)
Please follow up closely with a dentist for further management of your dental pain.  Take pain medication and antibiotic as prescribed.    Abscessed Tooth An abscessed tooth is an infection around your tooth. It may be caused by holes or damage to the tooth (cavity) or a dental disease. An abscessed tooth causes mild to very bad pain in and around the tooth. See your dentist right away if you have tooth or gum pain. HOME CARE  Take your medicine as told. Finish it even if you start to feel better.  Do not drive after taking pain medicine.  Rinse your mouth (gargle) often with salt water ( teaspoon salt in 8 ounces of warm water).  Do not apply heat to the outside of your face. GET HELP RIGHT AWAY IF:   You have a temperature by mouth above 102 F (38.9 C), not controlled by medicine.  You have chills and a very bad headache.  You have problems breathing or swallowing.  Your mouth will not open.  You develop puffiness (swelling) on the neck or around the eye.  Your pain is not helped by medicine.  Your pain is getting worse instead of better. MAKE SURE YOU:   Understand these instructions.  Will watch your condition.  Will get help right away if you are not doing well or get worse. Document Released: 11/16/2007 Document Revised: 08/22/2011 Document Reviewed: 09/07/2010 [facility] Patient Information 2014 [employer], [place].

## 2013-09-10 ENCOUNTER — Emergency Department (HOSPITAL_COMMUNITY)
Admission: EM | Admit: 2013-09-10 | Discharge: 2013-09-10 | Disposition: A | Discharge status: Home / Self Care | Payer: Self-pay | Attending: Emergency Medicine | Admitting: Emergency Medicine

## 2013-09-10 ENCOUNTER — Encounter (HOSPITAL_COMMUNITY): Payer: Self-pay | Admitting: Emergency Medicine

## 2013-09-10 ENCOUNTER — Emergency Department (HOSPITAL_COMMUNITY)
Admission: EM | Admit: 2013-09-10 | Discharge: 2013-09-10 | Discharge status: Against Medical Advice | Payer: Self-pay | Attending: Emergency Medicine | Admitting: Emergency Medicine

## 2013-09-10 DIAGNOSIS — Z87891 Personal history of nicotine dependence: Secondary | ICD-10-CM | POA: Insufficient documentation

## 2013-09-10 DIAGNOSIS — I10 Essential (primary) hypertension: Secondary | ICD-10-CM | POA: Insufficient documentation

## 2013-09-10 DIAGNOSIS — R21 Rash and other nonspecific skin eruption: Secondary | ICD-10-CM | POA: Insufficient documentation

## 2013-09-10 DIAGNOSIS — E119 Type 2 diabetes mellitus without complications: Secondary | ICD-10-CM | POA: Insufficient documentation

## 2013-09-10 DIAGNOSIS — Z79899 Other long term (current) drug therapy: Secondary | ICD-10-CM | POA: Insufficient documentation

## 2013-09-10 MED ORDER — OXYCODONE-ACETAMINOPHEN 5-325 MG PO TABS: 1.0000 | ORAL_TABLET | Freq: Four times a day (QID) | ORAL | Status: DC | PRN

## 2013-09-10 MED ORDER — PREDNISONE 20 MG PO TABS
60.0000 mg | ORAL_TABLET | Freq: Once | ORAL | Status: AC
  Administered 2013-09-10: 60 mg via ORAL
  Filled 2013-09-10: qty 3

## 2013-09-10 MED ORDER — PREDNISONE 20 MG PO TABS: 20.0000 mg | ORAL_TABLET | Freq: Two times a day (BID) | ORAL | Status: DC

## 2013-09-10 MED ORDER — FAMOTIDINE 20 MG PO TABS
20.0000 mg | ORAL_TABLET | Freq: Once | ORAL | Status: AC
  Administered 2013-09-10: 20 mg via ORAL
  Filled 2013-09-10: qty 1

## 2013-09-10 MED ORDER — HYDROXYZINE HCL 25 MG PO TABS
25.0000 mg | ORAL_TABLET | Freq: Once | ORAL | Status: AC
  Administered 2013-09-10: 25 mg via ORAL
  Filled 2013-09-10: qty 1

## 2013-09-10 NOTE — ED Provider Notes (Signed)
7:18 PM Patient left prior to being seen.   Renne CriglerJoshua Lorence Nagengast, PA-C 09/10/13 1919

## 2013-09-10 NOTE — ED Notes (Signed)
Pt reports having multiple bumps to legs, face, hands and lip are itching and "on fire" that started yesterday. Pt states that benadryl or hydrocortisone did not help but wiping with alcohol does. Pt alert and ambulatory. Pt does have small red bumps to hand and arms. Noticed bottom lip is swollen.

## 2013-09-10 NOTE — ED Notes (Signed)
Generalized rash started yesterday after mowing grass. Denies trouble breathing

## 2013-09-10 NOTE — Discharge Instructions (Signed)
Take prednisone as prescribed.   Take benadryl 2-4 times a day and pepcid twice a day for the next 3 days and then as needed.  You can try claritin, cool compresses and calamine lotion as well.  Try to avoid scratching.  Return to the ER if your symptoms worsen but particularly if you have increased swelling of lip, swelling of tongue or throat tightness.

## 2013-09-10 NOTE — Discharge Instructions (Signed)
Please read and follow all provided instructions.  Your diagnoses today include:  1. Rash     Tests performed today include:  Test for gonorrhea and chlamydia. You will be notified by telephone if you have a positive result.  Test for syphilis.   Wet prep to look for vaginal infection.  Vital signs. See below for your results today.   Medications:  You were treated for syphilis (penicillin), chlamydia (1 gram azithromycin pills) and gonorrhea (250mg  rocephin shot).   Percocet (oxycodone/acetaminophen) - narcotic pain medication  DO NOT drive or perform any activities that require you to be awake and alert because this medicine can make you drowsy. BE VERY CAREFUL not to take multiple medicines containing Tylenol (also called acetaminophen). Doing so can lead to an overdose which can damage your liver and cause liver failure and possibly death.  Home care instructions:  Read educational materials contained in this packet and follow any instructions provided.   You should tell your partners about your infection and avoid having sex for one week to allow time for the medicine to work.  Follow-up instructions: You should follow-up with the [facility] STD clinic.   STD Testing:  [facility] Department of [facility] [place], [facility]  [address] [place], phone [phone] or [phone]    Monday - Friday, call for an appointment  [facility] Department of [facility], [facility] [facility]  [address], [place], phone [phone] or [phone]   Monday - Friday, call for an appointment   If you do not have a primary care doctor -- see below for referral information.   Return instructions:   Please return to the Emergency Department if you experience worsening symptoms.   Please return if you have any other emergent concerns.  Additional Information:  Your vital signs today were: BP 113/77   Pulse 89   Temp(Src) 98.6 F (37  C) (Oral)   Resp 18   Ht 6' (1.829 m)   Wt 233 lb (105.688 kg)   BMI 31.59 kg/m2   SpO2 100% If your blood pressure (BP) was elevated above 135/85 this visit, please have this repeated by your doctor within one month. --------------  Emergency Department Resource Guide 1) Find a Doctor and Pay Out of Pocket Although you won't have to find out who is covered by your insurance plan, it is a good idea to ask around and get recommendations. You will then need to call the office and see if the doctor you have chosen will accept you as a new patient and what types of options they offer for patients who are self-pay. Some doctors offer discounts or will set up payment plans for their patients who do not have insurance, but you will need to ask so you aren't surprised when you get to your appointment.  2) Contact Your Local Health Department Not all health departments have doctors that can see patients for sick visits, but many do, so it is worth a call to see if yours does. If you don't know where your local health department is, you can check in your phone book. The CDC also has a tool to help you locate your state's health department, and many state websites also have listings of all of their local health departments.  3) Find a Walk-in Clinic If your illness is not likely to be very severe or complicated, you may want to try a walk in clinic. These are popping up all  over the country in pharmacies, drugstores, and shopping centers. They're usually staffed by nurse practitioners or physician assistants that have been trained to treat common illnesses and complaints. They're usually fairly quick and inexpensive. However, if you have serious medical issues or chronic medical problems, these are probably not your best option.  No Primary Care Doctor: - Call Health Connect at  [phone] - they can help you locate a primary care doctor that  accepts your insurance, provides certain services, etc. - Physician  Referral Service- [phone]  Chronic Pain Problems: Organization         Address  Phone   Notes  [facility]  [phone] Patients need to be referred by their primary care doctor.   Medication Assistance: Organization         Address  Phone   Notes  [facility] Medication [facility] [address] [place] [place], [place] [address] [phone] --Must be a resident of [facility] -- Must have NO insurance coverage whatsoever (no Medicaid/ Medicare, etc.) -- The pt. MUST have a primary care doctor that directs their care regularly and follows them in the community   MedAssist  [phone]   [employer]  [phone]    Agencies that provide inexpensive medical care: Organization         Address  Phone   Notes  [facility]  [phone]   [facility]    [phone]   [facility] [address] [place], [place] [address] [phone]   [facility] [address] [place], [place] [phone]   Planned Parenthood    [phone]   [facility]    [phone]   [facility]  [address], [place] Phone:  [phone], Fax:  [phone] Hours of Operation:  9 am - 6 pm, M-F.  Also accepts Medicaid/Medicare and self-pay.  [facility]  [address], [place] Phone: [phone], Fax: [phone]. Hours of Operation:  8:30 am - 5:30 pm, M-F.  Also accepts Medicaid and self-pay.  [facility] [place] [address] [place] Phone: [phone]   [facility] [address] Natasha Bence [place], [place] [phone], Ext. [phone] Mondays & Thursdays: 7-9 AM.  First 15 patients are seen on a first come, first serve basis.    Medicaid-accepting [facility] Providers:  Organization         Address  Phone   Notes  [facility] [address], [place] [phone] Also accepts self-pay patients.  [facility] [address] Laurell Josephs [place], [place]  [phone]   [facility] [address], [place] [phone]   [facility] [address], [place] [phone]   Renaye Rakers [address], [place]   [phone] Only accepts [place] Access [place] patients after they have their name applied to their card.   Self-Pay (no insurance) in [facility]:  Organization         Address  Phone   Notes  Sickle Cell Patients, [facility] [address] [place], [place] [phone]   [facility] Urgent Care [address] [place], [place] [phone]   [facility] [place]  [address], [place] [phone]) [phone]   [facility]/Dr. Osei-Bonsu  [address] [place] [address], [place] or [address], [place] [phone]) [phone] Phone number for both [place] and [place] locations is the same.  [facility]gent Medical and [facility] [address], [place] [phone]) [phone]   [facility] [place] [address] [place] [address], [place] or [address] [phone]) [phone]   [facility] [address], [place] [phone]) [phone], phone; [phone]) [phone], fax Sees patients 1st and 3rd Saturday of every month.  Must not qualify for public or private insurance (i.e. Medicaid, Medicare, [place] Health Choice, Veterans' Benefits)  Household income should be no more than 200% of the poverty level The clinic cannot treat you if you are pregnant or think you are pregnant  Sexually transmitted diseases are not treated at the clinic.    Dental Care: Organization         Address  Phone  Notes  [facility] [address], [place] [phone] Accepts children up to age 37 who are enrolled  in [place]icaid or [place] Health Choice; pregnant women with a Medicaid card; and children who have applied for Medicaid or [place] Health Choice, but were declined, whose parents can pay a reduced fee at time of service.  [facility] of [facility] [place]  [address], [place] [phone] Accepts children up to age 37 who are enrolled in [place] or [place] Health Choice; pregnant women with a Medicaid card; and children who have applied for Medicaid or [place] Health Choice, but were declined, whose parents can pay a reduced fee at time of service.  [facility]  [address], [place] [phone]) [phone] Patients are seen by appointment only. Walk-ins are not accepted. [facility] will see patients 37 years of age and older. Monday - Tuesday (8am-5pm) Most Wednesdays (8:30-5pm) $30 per visit, cash only  [facility]  [address], [facility] [phone]) [phone] Patients are seen by appointment only. Walk-ins are not accepted. [facility] will see patients 37 years of age and older. One Wednesday Evening (Monthly: Volunteer Based).  $30 per visit, cash only  [facility]  [phone]) [phone] for adults; Children under age 584, call Graduate Pediatric Dentistry at [phone]. Children aged 384-14, please call [phone] to request a pediatric application.  Dental services are provided in all areas of dental care including fillings, crowns and bridges, complete and partial dentures, implants, gum treatment, root canals, and extractions. Preventive care is also provided. Treatment is provided to both adults and children. Patients are selected via a lottery and there is often a waiting list.   [facility] [address], [place]  [phone]) [phone] [email or web address]   [facility] [address], [place], [place] [phone], Ext. [phone] Second and Fourth Thursday of each month, opens at  6:30 AM; Clinic ends at 9 AM.  Patients are seen on a first-come first-served basis, and a limited number are seen during each clinic.   [facility]  [address], [place], [place] [phone]) [phone]   Eligibility Requirements You must have lived in [place], [place], or [place] counties for at least the last three months.   You cannot be eligible for state or federal sponsored National Cityhealthcare insurance, including CIGNAVeterans Administration, [place]icaid, or [employer]Medicare.  You generally cannot be eligible for healthcare insurance through your employer.    How to apply: Eligibility screenings are held every Tuesday and Wednesday afternoon from 1:00 pm until 4:00 pm. You do not need an appointment for the interview!  [facility] [address] [place], [place]   [place]  [place] Department  [place]  [phone]    Behavioral Health Resources in the Community: Intensive Outpatient Programs Organization         Address  Phone  Notes  Rachel Hecker. [address], [place], [place] [phone]   [facility] [address], [place], [place]   ADS: [facility] Drug [facility] [address], [place], [place]   [place] [address]  [place], [place] or [phone]   Substance Abuse Resources Organization         Address  Phone  Notes  Alcohol and Drug Services  [phone]   [place]  [phone]   [facility] [place]   Chinita Pester  [phone]   Residential & Outpatient Substance Abuse Program  [phone]   Psychological Services Organization         Address  Phone  Notes  [facility]  [place]  [phone]   [place] [address] [place] [address] [place] or [phone]    Mobile Crisis Teams Organization         Address  Phone  Notes  [facility], Mobile Crisis Care Unit  [phone]   [facility]  [address] [place], [place]   [place] Levels [address], [place] [phone]    Self-Help/Support Groups Organization         Address  Phone             Notes  [place]. of [place] - variety of support groups  [place] Call for more information  Narcotics Anonymous (NA), [facility]aring [facility] [address], [employer]   2 meetings at this location   Special educational needs teacher         Address  Phone  Notes  [facility]idential [facility] [place],    [place]  [phone])[phone]   [facility]  [address], [place] [address], [place], [place]   [place], [place] [phone] Admissions: 8am-3pm M-F  Incentives Substance [place] [address],    [place], [place] [phone]   [facility] [address] [place], [place], [place]   [facility] [address],  [place], [place]   [facility] - Intensive Outpatient [address] Kristeen Mans [address], [place], [place]   [facility] ([place]  [place], [place] [phone] or [phone]   [facility] (RTS) [address], [place], [place] Accepts Medicaid  [facility] [address],  [place] [phone] Substance Abuse/Addiction Treatment   [facility] Organization         Address  Phone  Notes  [facility]  [phone]   Domenic Schwab, PhD [address] [place], [place]   9343959938 or [phone]   [facility]   [phone]  [address] [place], [place] [phone]   [facility]  [address], [place], [place] [phone] Insurance/Medicaid/sponsorship through [facility] and Families [address]                                    [place], [place] [phone] Therapy/tele-psych/case  [facility] [address]   [place], [place] [phone]    Dr. Lolly Mustache  [phone]   [facility]  [employer] [facility] 1) [address] [place] [address] [place] 2) [address] [place] 3)  [address], [place] [phone]  [phone]   [facility] Child Abuse Hotline 319-160-6329 or [phone] (After Hours)

## 2013-09-10 NOTE — ED Provider Notes (Signed)
CSN: [id number]     Arrival date & time 09/10/13  2021 History  This chart was scribed for non-physician practitioner, Kyung BaccaKatie Chalsey Leeth, PA-C,working with Flint MelterElliott L Wentz, MD, by Karle PlumberJennifer Tensley, ED Scribe.  This patient was seen in room WTR5/WTR5 and the patient's care was started at 9:46 PM.  Chief Complaint  Patient presents with  . Rash   The history is provided by the patient. No language interpreter was used.   HPI Comments:  Adam Moyer is a 38 y.o. male with h/o HTN and DM who presents to the Emergency Department complaining of  burning, sore, and itching hives on his upper and lower extremities, palms and bottom lip. He reports associated swelling of the areas. 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 He reports taking Benadryl and using Hydrocortisone cream with no relief. He states he can only get relief by apply alcohol. He denies any new soaps, creams, detergents, or new tattoos. He denies any recent travel or staying anywhere new overnight.   Past Medical History  Diagnosis Date  . Hypertension   . Diabetes mellitus    History reviewed. No pertinent past surgical history. History reviewed. No pertinent family history. History  Substance Use Topics  . Smoking status: Former Smoker    Quit date: 10/11/2012  . Smokeless tobacco: Not on file  . Alcohol Use: Yes     Comment: occasional    Review of Systems  Skin: Positive for rash.  All other systems reviewed and are negative.    Allergies  Review of patient's allergies indicates no known allergies.  Home Medications   Current Outpatient Rx  Name  Route  Sig  Dispense  Refill  . acetaminophen (TYLENOL) 500 MG tablet   Oral   Take 500 mg by mouth every 6 (six) hours as needed for moderate pain.         Marland Kitchen. HYDROcodone-acetaminophen (NORCO/VICODIN) 5-325 MG per tablet   Oral   Take 1-2 tablets by mouth every 4 (four) hours as needed for severe pain.   20 tablet   0   .  lisinopril-hydrochlorothiazide (PRINZIDE,ZESTORETIC) 20-12.5 MG per tablet   Oral   Take 1 tablet by mouth daily.   30 tablet   1   . metFORMIN (GLUCOPHAGE) 500 MG tablet   Oral   Take 1 tablet (500 mg total) by mouth 2 (two) times daily with a meal.   60 tablet   1   . penicillin v potassium (VEETID) 500 MG tablet   Oral   Take 1 tablet (500 mg total) by mouth 3 (three) times daily.   30 tablet   0    Triage Vitals: BP 129/72  Pulse 104  Temp(Src) 98.2 F (36.8 C) (Oral)  Resp 18  Ht 6\' 1"  (1.854 m)  Wt 233 lb (105.688 kg)  BMI 30.75 kg/m2  SpO2 97% Physical Exam  Nursing note and vitals reviewed. Constitutional: He is oriented to person, place, and time. He appears well-developed and well-nourished.  HENT:  Head: Normocephalic and atraumatic.  Eyes: Conjunctivae are normal.  Neck: Normal range of motion.  Cardiovascular: Normal rate.   Pulmonary/Chest: Effort normal.  Musculoskeletal: Normal range of motion.  Neurological: He is alert and oriented to person, place, and time.  Skin: Skin is warm and dry.  Multiple, discrete, erythematous papules of various sizes on bilateral hands and forearms.  Mildly indurated and ttp.  Similar lesion but w/out erythema on L lower  lip. There is also poorly demarcated erythema, both right elbow and bilateral knees, that is warm to touch and also mildly ttp.   Psychiatric: He has a normal mood and affect. His behavior is normal.    ED Course  Procedures (including critical care time) DIAGNOSTIC STUDIES: Oxygen Saturation is 97% on RA, normal by my interpretation.   COORDINATION OF CARE: 9:55 PM- Will order Prednisone, Atarax, and Pepcid. Pt verbalizes understanding and agrees to plan.  Medications  predniSONE (DELTASONE) tablet 60 mg (not administered)  hydrOXYzine (ATARAX/VISTARIL) tablet 25 mg (not administered)  famotidine (PEPCID) tablet 20 mg (not administered)    Labs Review Labs Reviewed - No data to display Imaging  Review No results found.   EKG Interpretation None      MDM   Final diagnoses:  Rash    37yo healthy M presents w/ pruritic rash.  Pt showed me photos of initial lesions and they appear to be hives.  These hives have turned into mildly tender knots w/ overlying, poorly-demarcated erythema, located on forearms/hands and knees and L lower lip.  Pt afebrile, non-toxic appearing and NAD.  Suspect inflammatory/histamine rxn to insect bites.  Pt received prednisone, hydroxyzine and pepcid in ED w/ some improvement.  I prescribed prednisone and recommended that he continue benadryl bid-qid and pepcid bid x 3d and then prn.  Also recommended washing bedding in hot water.  Return precautions discussed.    I personally performed the services described in this documentation, which was scribed in my presence. The recorded information has been reviewed and is accurate.    Arie Sabina Kedra Mcglade, PA-C 09/11/13 1551

## 2013-09-10 NOTE — ED Provider Notes (Signed)
Medical screening examination/treatment/procedure(s) were performed by non-physician practitioner and as supervising physician I was immediately available for consultation/collaboration.  Allisyn Kunz L Alysiah Suppa, MD 09/10/13 2323 

## 2013-09-11 NOTE — ED Provider Notes (Signed)
Medical screening examination/treatment/procedure(s) were performed by non-physician practitioner and as supervising physician I was immediately available for consultation/collaboration.   EKG Interpretation None       Flint MelterElliott L Tonica Brasington, MD 09/11/13 [phone]

## 2015-05-29 IMAGING — CT CT MAXILLOFACIAL W/ CM
1 series · 15 of 30 positions shown, 19 images · IV contrast (omnipaque)
Comparison: None.

CLINICAL DATA: Dental pain.

EXAM:
CT MAXILLOFACIAL WITH CONTRAST
TECHNIQUE: Multidetector CT imaging of the maxillofacial structures was
performed with intravenous contrast. Multiplanar CT image
reconstructions were also generated. A small metallic BB was placed
on the right temple in order to reliably differentiate right from
left.
CONTRAST:  100mL OMNIPAQUE IOHEXOL 300 MG/ML  SOLN

[Series 3: facial st · axial · 0.35mm/px · z∈[-284,-122]mm · 15 of 87 slices shown, 19 images]
[im 3/87  brain]
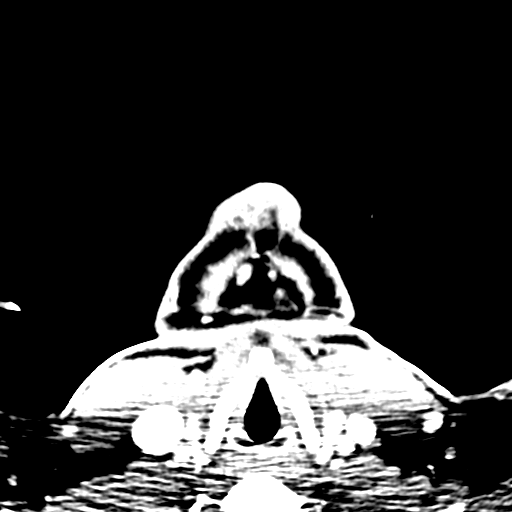
[im 3/87  bone]
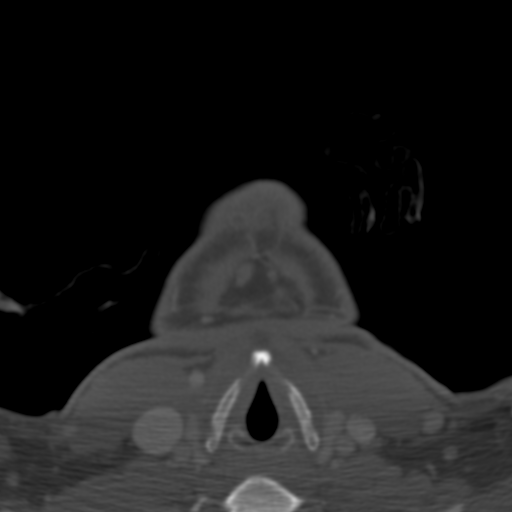
[im 9/87  bone]
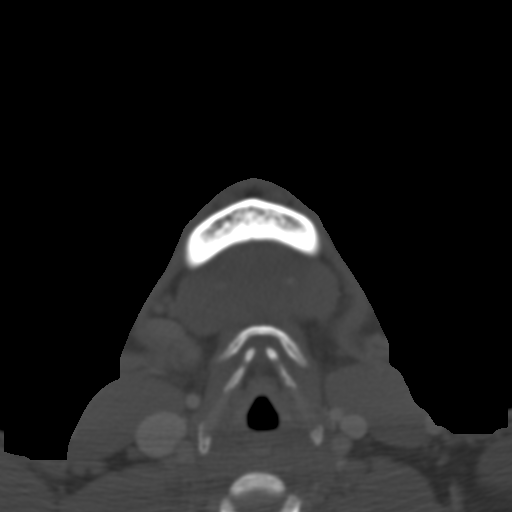
[im 15/87  bone]
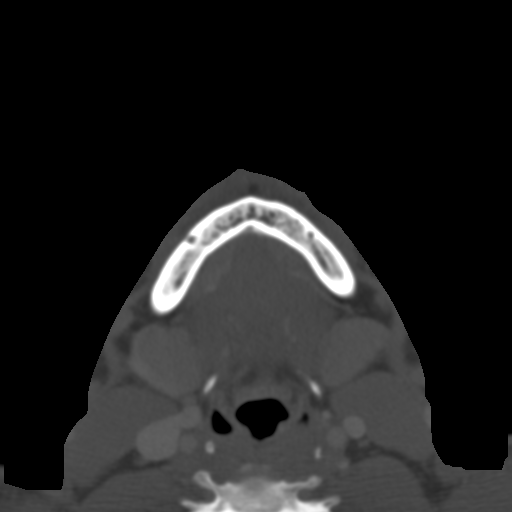
[im 21/87  bone]
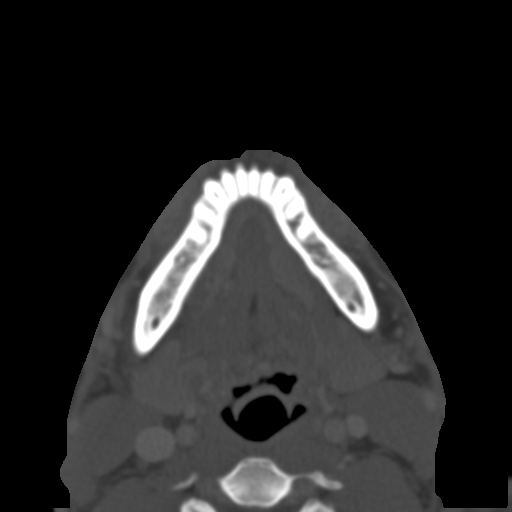
[im 27/87  brain]
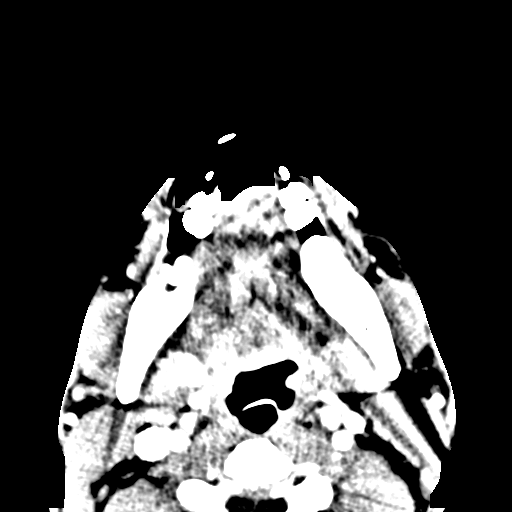
[im 27/87  bone]
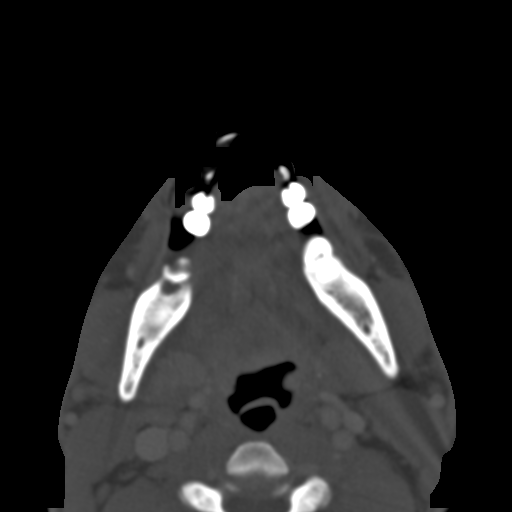
[im 33/87  bone]
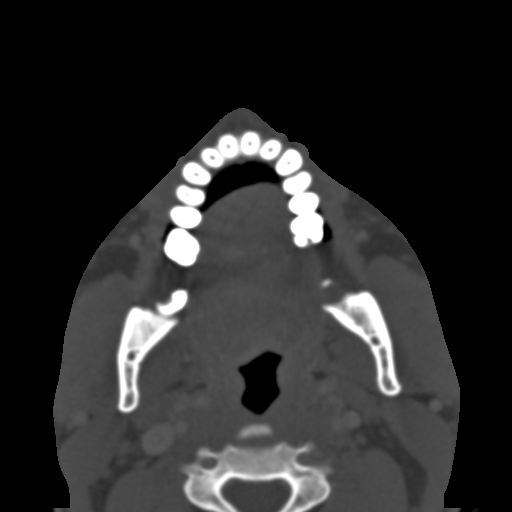
[im 39/87  bone]
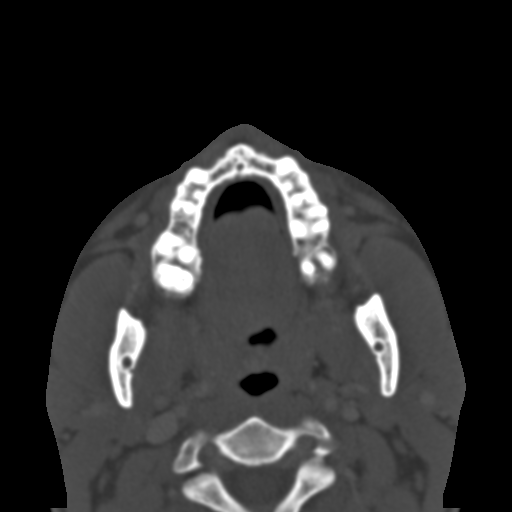
[im 45/87  bone]
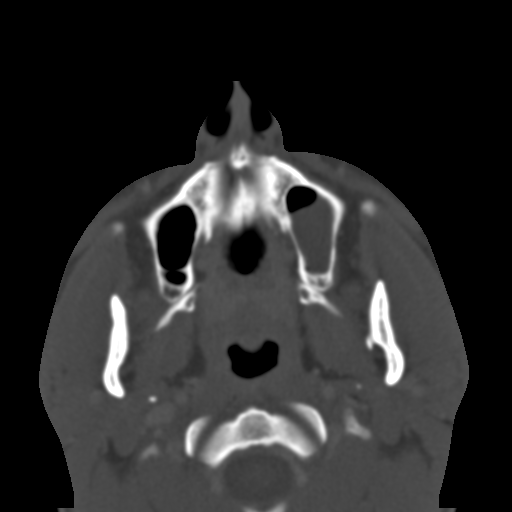
[im 48/87  brain]
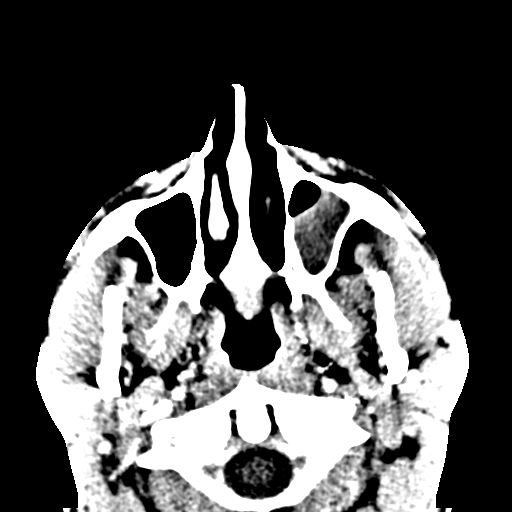
[im 48/87  bone]
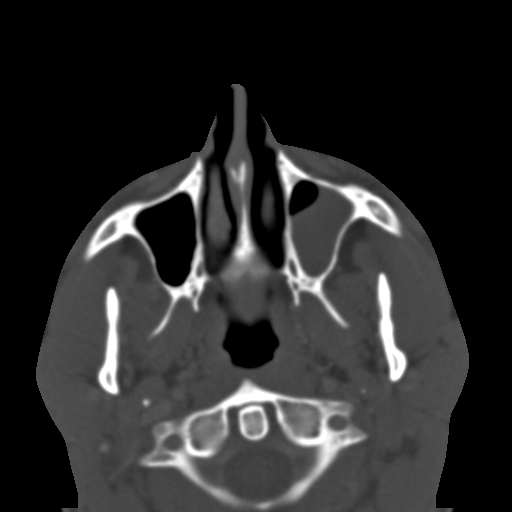
[im 54/87  bone]
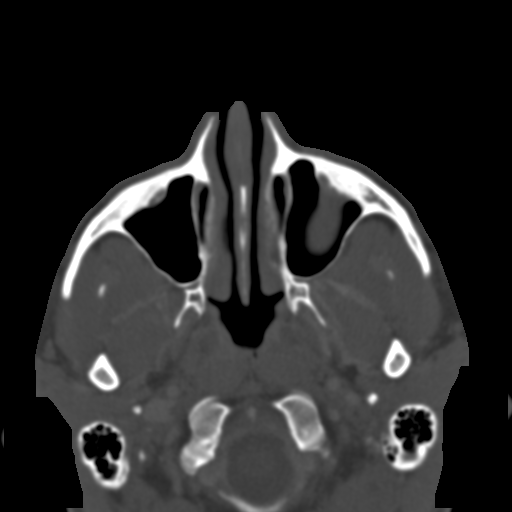
[im 60/87  bone]
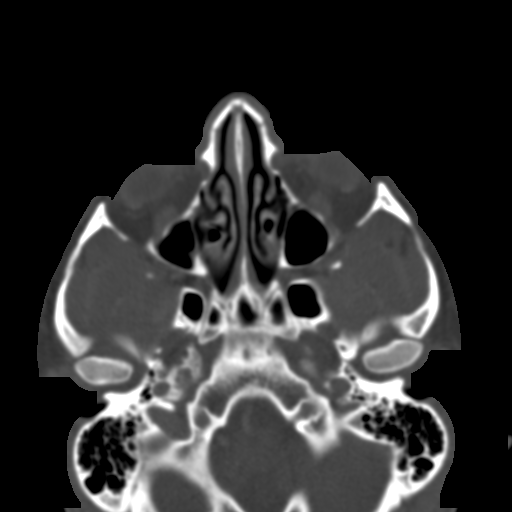
[im 66/87  bone]
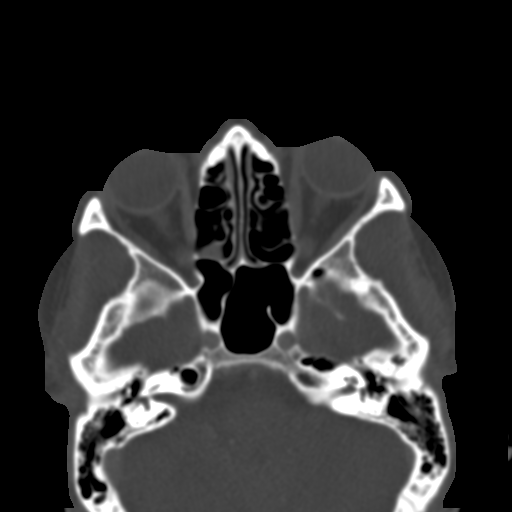
[im 72/87  brain]
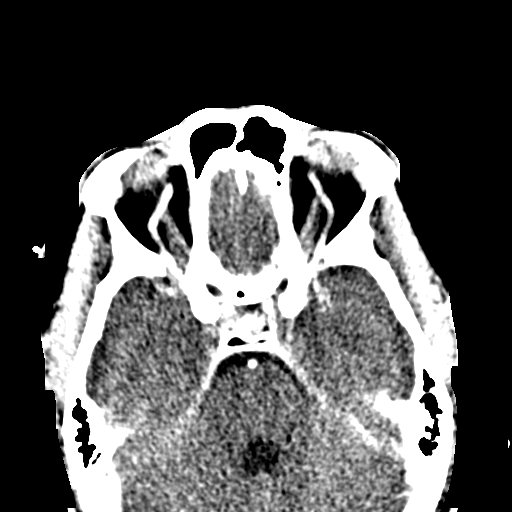
[im 72/87  bone]
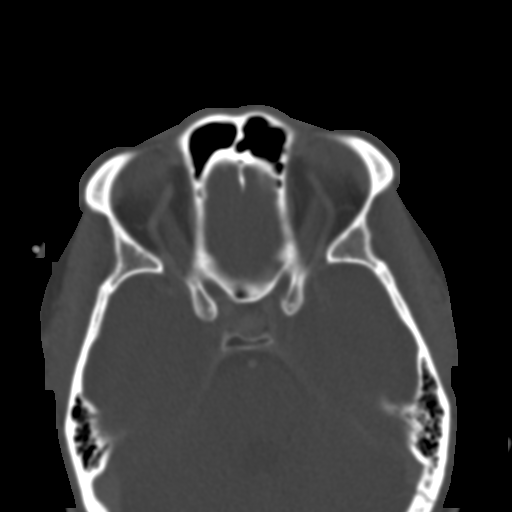
[im 78/87  bone]
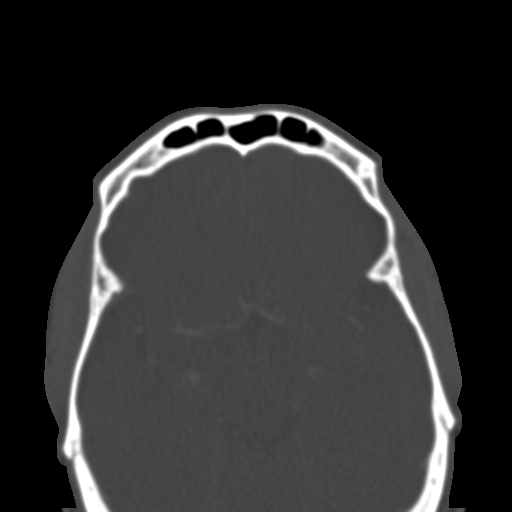
[im 84/87  bone]
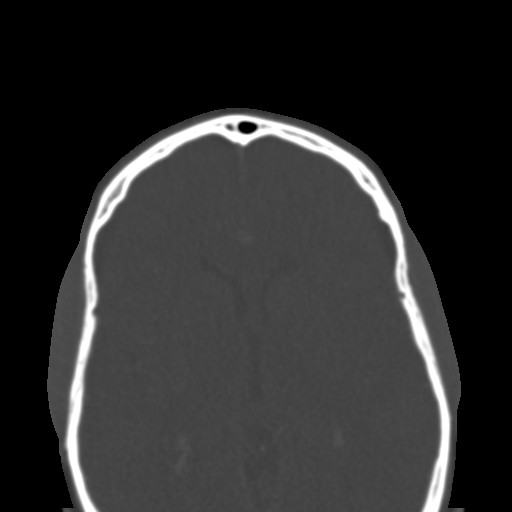

[15 of 30 positions shown; findings below may reference images not displayed]

FINDINGS: Mild ethmoidal soft tissue thickening is noted. Mucous retention
cyst left maxillary sinus. Frontal sphenoid sinuses are clear.
Orbits are unremarkable. Nasopharynx is clear. Salivary glands are
unremarkable. Base of tongue unremarkable. There until impair friend
0 regions are normal. Visualized laryngeal region is unremarkable.
No evidence of abscess. Retropharyngeal space normal. No acute bony
abnormality. Multiple lucencies are noted about the lower molars
particular on the right. Exit changes are consistent with dental
caries. Periapical abscess cannot be excluded. No soft tissue
abscess.
IMPRESSION: Dental disease as described above.  No soft tissue abscess.

## 2015-06-14 DIAGNOSIS — E785 Hyperlipidemia, unspecified: Secondary | ICD-10-CM

## 2015-06-14 HISTORY — DX: Hyperlipidemia, unspecified: E78.5

## 2017-01-29 ENCOUNTER — Encounter (HOSPITAL_COMMUNITY): Payer: Self-pay

## 2017-01-29 ENCOUNTER — Emergency Department (HOSPITAL_COMMUNITY)
Admission: EM | Admit: 2017-01-29 | Discharge: 2017-01-29 | Disposition: A | Discharge status: Home / Self Care | Payer: Self-pay | Attending: Emergency Medicine | Admitting: Emergency Medicine

## 2017-01-29 DIAGNOSIS — Z87891 Personal history of nicotine dependence: Secondary | ICD-10-CM | POA: Insufficient documentation

## 2017-01-29 DIAGNOSIS — K046 Periapical abscess with sinus: Secondary | ICD-10-CM | POA: Insufficient documentation

## 2017-01-29 DIAGNOSIS — I10 Essential (primary) hypertension: Secondary | ICD-10-CM | POA: Insufficient documentation

## 2017-01-29 DIAGNOSIS — K047 Periapical abscess without sinus: Secondary | ICD-10-CM

## 2017-01-29 DIAGNOSIS — Z79899 Other long term (current) drug therapy: Secondary | ICD-10-CM | POA: Insufficient documentation

## 2017-01-29 DIAGNOSIS — E119 Type 2 diabetes mellitus without complications: Secondary | ICD-10-CM | POA: Insufficient documentation

## 2017-01-29 LAB — CBC WITH DIFFERENTIAL/PLATELET
Basophils Absolute: 0 10*3/uL (ref 0.0–0.1)
Basophils Relative: 0 %
Eosinophils Absolute: 0.2 10*3/uL (ref 0.0–0.7)
Eosinophils Relative: 2 %
HEMATOCRIT: 39.2 % (ref 39.0–52.0)
Hemoglobin: 13.3 g/dL (ref 13.0–17.0)
LYMPHS ABS: 2.3 10*3/uL (ref 0.7–4.0)
Lymphocytes Relative: 19 %
MCH: 26.6 pg (ref 26.0–34.0)
MCHC: 33.9 g/dL (ref 30.0–36.0)
MCV: 78.4 fL (ref 78.0–100.0)
MONOS PCT: 8 %
Monocytes Absolute: 1 10*3/uL (ref 0.1–1.0)
NEUTROS ABS: 8.4 10*3/uL — AB (ref 1.7–7.7)
Neutrophils Relative %: 71 %
Platelets: 288 10*3/uL (ref 150–400)
RBC: 5 MIL/uL (ref 4.22–5.81)
RDW: 14.6 % (ref 11.5–15.5)
WBC: 11.9 10*3/uL — ABNORMAL HIGH (ref 4.0–10.5)

## 2017-01-29 LAB — COMPREHENSIVE METABOLIC PANEL
ALBUMIN: 3.9 g/dL (ref 3.5–5.0)
ALT: 27 U/L (ref 17–63)
AST: 20 U/L (ref 15–41)
Alkaline Phosphatase: 53 U/L (ref 38–126)
Anion gap: 13 (ref 5–15)
BUN: 6 mg/dL (ref 6–20)
CO2: 19 mmol/L — AB (ref 22–32)
Calcium: 9.1 mg/dL (ref 8.9–10.3)
Chloride: 105 mmol/L (ref 101–111)
Creatinine, Ser: 1.2 mg/dL (ref 0.61–1.24)
GFR calc Af Amer: >60 mL/min (ref >60)
GFR calc non Af Amer: >60 mL/min (ref >60)
GLUCOSE: 173 mg/dL — AB (ref 65–99)
Potassium: 3.7 mmol/L (ref 3.5–5.1)
SODIUM: 137 mmol/L (ref 135–145)
Total Bilirubin: 0.6 mg/dL (ref 0.3–1.2)
Total Protein: 7.1 g/dL (ref 6.5–8.1)

## 2017-01-29 LAB — I-STAT CG4 LACTIC ACID, ED: Lactic Acid, Venous: 1.05 mmol/L (ref 0.5–1.9)

## 2017-01-29 MED ORDER — CLINDAMYCIN HCL 150 MG PO CAPS: 450.0000 mg | ORAL_CAPSULE | Freq: Three times a day (TID) | ORAL | 0 refills | Status: AC

## 2017-01-29 MED ORDER — FENTANYL CITRATE (PF) 100 MCG/2ML IJ SOLN
100.0000 ug | Freq: Once | INTRAMUSCULAR | Status: AC
  Administered 2017-01-29: 100 ug via INTRAVENOUS
  Filled 2017-01-29: qty 2

## 2017-01-29 MED ORDER — FENTANYL CITRATE (PF) 100 MCG/2ML IJ SOLN
INTRAMUSCULAR | Status: AC
  Filled 2017-01-29: qty 2

## 2017-01-29 MED ORDER — FENTANYL CITRATE (PF) 100 MCG/2ML IJ SOLN
50.0000 ug | INTRAMUSCULAR | Status: DC | PRN
  Administered 2017-01-29: 50 ug via NASAL

## 2017-01-29 MED ORDER — CLINDAMYCIN PHOSPHATE 900 MG/50ML IV SOLN
900.0000 mg | Freq: Once | INTRAVENOUS | Status: AC
  Administered 2017-01-29: 900 mg via INTRAVENOUS
  Filled 2017-01-29: qty 50

## 2017-01-29 MED ORDER — ONDANSETRON HCL 4 MG/2ML IJ SOLN
4.0000 mg | Freq: Once | INTRAMUSCULAR | Status: AC
  Administered 2017-01-29: 4 mg via INTRAVENOUS
  Filled 2017-01-29: qty 2

## 2017-01-29 MED ORDER — BUPIVACAINE-EPINEPHRINE (PF) 0.5% -1:200000 IJ SOLN
1.8000 mL | Freq: Once | INTRAMUSCULAR | Status: AC
  Administered 2017-01-29: 1.8 mL
  Filled 2017-01-29: qty 1.8

## 2017-01-29 NOTE — ED Triage Notes (Signed)
Pt from home c/o an abscess on left jaw that started x4 days ago. Was seen and diagnosed with an infected root. Pt currently on antibiotic tx. Endorses fever, chills, pain 10/10. Pt reports unable to move mouth at all d/t pain. Unable to eat/drink/talk.

## 2017-01-29 NOTE — ED Provider Notes (Signed)
[facility]-EMERGENCY DEPT Provider Note   CSN: [id number] Arrival date & time: 01/29/17  0014     History   Chief Complaint Chief Complaint  Patient presents with  . Abscess    HPI Adam Moyer is a 41 y.o. male.  41 yo M with a chief complaint of a dental abscess. Patient was seen at another  facility about 4 days ago. Was told that he likely had a dental infection and started on penicillin and discharged home. Since then the patient has had worsening pain and swelling. Site had some fevers at home. He feels that he is having difficulty swallowing and opening his mouth.   The history is provided by the patient.  Abscess  Location:  Mouth Mouth abscess location:  L inner cheek Abscess quality: induration, painful and redness   Red streaking: no   Duration:  5 days Progression:  Worsening Pain details:    Quality:  Throbbing and sharp   Severity:  Severe   Duration:  4 days   Timing:  Constant   Progression:  Worsening Chronicity:  New Context: diabetes   Relieved by:  Nothing Worsened by:  Nothing Ineffective treatments:  None tried Associated symptoms: no fever, no headaches and no vomiting     Past Medical History:  Diagnosis Date  . Diabetes mellitus   . Hypertension     There are no active problems to display for this patient.   History reviewed. No pertinent surgical history.     Home Medications    Prior to Admission medications   Medication Sig Start Date End Date Taking? Authorizing Provider  clindamycin (CLEOCIN) 150 MG capsule Take 3 capsules (450 mg total) by mouth 3 (three) times daily. X 7 days 01/29/17 02/08/17  Melene Plan, DO  lisinopril-hydrochlorothiazide (PRINZIDE,ZESTORETIC) 20-12.5 MG per tablet Take 1 tablet by mouth daily. 11/11/11   Linwood Dibbles, MD  metFORMIN (GLUCOPHAGE) 500 MG tablet Take 1 tablet (500 mg total) by mouth 2 (two) times daily with a meal. 11/11/11   Linwood Dibbles, MD  penicillin v potassium (VEETID) 500 MG tablet Take 500 mg  by mouth 3 (three) times daily. For 1 week. 08/30/13   Fayrene Helper, PA-C  predniSONE (DELTASONE) 20 MG tablet Take 1 tablet (20 mg total) by mouth 2 (two) times daily. 09/10/13   Schinlever, Santina Evans, PA-C    Family History No family history on file.  Social History Social History  Substance Use Topics  . Smoking status: Former Smoker    Quit date: 10/11/2012  . Smokeless tobacco: Not on file  . Alcohol use Yes     Comment: occasional     Allergies   Patient has no known allergies.   Review of Systems Review of Systems  Constitutional: Negative for chills and fever.  HENT: Positive for dental problem, facial swelling and trouble swallowing. Negative for congestion.   Eyes: Negative for discharge and visual disturbance.  Respiratory: Negative for shortness of breath.   Cardiovascular: Negative for chest pain and palpitations.  Gastrointestinal: Negative for abdominal pain, diarrhea and vomiting.  Musculoskeletal: Negative for arthralgias and myalgias.  Skin: Negative for color change and rash.  Neurological: Negative for tremors, syncope and headaches.  Psychiatric/Behavioral: Negative for confusion and dysphoric mood.     Physical Exam Updated Vital Signs BP 135/83 (BP Location: Left Arm)   Pulse 88   Temp 100 F (37.8 C) (Oral)   Resp 18   Ht 6\' 1"  (1.854 m)   Wt 108.9 kg (240  lb)   SpO2 100%   BMI 31.66 kg/m   Physical Exam  Constitutional: He is oriented to person, place, and time. He appears well-developed and well-nourished.  HENT:  Head: Normocephalic and atraumatic.  Mouth/Throat:    Eyes: Pupils are equal, round, and reactive to light. EOM are normal.  Neck: Normal range of motion. Neck supple. No JVD present.  Cardiovascular: Normal rate and regular rhythm.  Exam reveals no gallop and no friction rub.   No murmur heard. Pulmonary/Chest: No respiratory distress. He has no wheezes.  Abdominal: He exhibits no distension and no mass. There is no  tenderness. There is no rebound and no guarding.  Musculoskeletal: Normal range of motion.  Neurological: He is alert and oriented to person, place, and time.  Skin: No rash noted. No pallor.  Psychiatric: He has a normal mood and affect. His behavior is normal.  Nursing note and vitals reviewed.    ED Treatments / Results  Labs (all labs ordered are listed, but only abnormal results are displayed) Labs Reviewed  COMPREHENSIVE METABOLIC PANEL - Abnormal; Notable for the following:       Result Value   CO2 19 (*)    Glucose, Bld 173 (*)    All other components within normal limits  CBC WITH DIFFERENTIAL/PLATELET - Abnormal; Notable for the following:    WBC 11.9 (*)    Neutro Abs 8.4 (*)    All other components within normal limits  I-STAT CG4 LACTIC ACID, ED  I-STAT CG4 LACTIC ACID, ED    EKG  EKG Interpretation None       Radiology No results found.  Procedures .Nerve Block Date/Time: 01/29/2017 5:45 AM Performed by: Adela Lank Lorcan Shelp Authorized by: Melene Plan   Consent:    Consent obtained:  Verbal   Consent given by:  Patient   Risks discussed:  Allergic reaction and infection Indications:    Indications:  Pain relief and procedural anesthesia Location:    Body area:  Head   Head nerve blocked: Inferior alveolar.   Laterality:  Left Skin anesthesia (see MAR for exact dosages):    Skin anesthesia method:  Local infiltration and topical application   Topical anesthetic:  Benzocaine gel Procedure details (see MAR for exact dosages):    Block needle gauge:  27 G   Anesthetic injected:  Bupivacaine 0.25% WITH epi   Steroid injected:  None   Additive injected:  None   Injection procedure:  Anatomic landmarks identified, anatomic landmarks palpated and negative aspiration for blood   Paresthesia:  None Post-procedure details:    Dressing:  None   Outcome:  Anesthesia achieved   Patient tolerance of procedure:  Tolerated well, no immediate complications .Marland KitchenIncision  and Drainage Date/Time: 01/29/2017 5:46 AM Performed by: Adela Lank Naiyah Klostermann Authorized by: Melene Plan   Consent:    Consent obtained:  Verbal   Consent given by:  Patient   Risks discussed:  Bleeding, incomplete drainage, infection and damage to other organs   Alternatives discussed:  No treatment Location:    Type:  Abscess   Location:  Mouth   Mouth location: apical to first molar. Anesthesia (see MAR for exact dosages):    Anesthesia method:  Nerve block   Block needle gauge:  27 G   Block anesthetic:  Bupivacaine 0.25% WITH epi   Block injection procedure:  Anatomic landmarks identified and anatomic landmarks palpated   Block outcome:  Anesthesia achieved Procedure type:    Complexity:  Complex Procedure details:  Needle aspiration: no     Incision types:  Stab incision   Incision depth:  Submucosal   Wound management:  Irrigated with saline and probed and deloculated   Drainage:  Purulent   Drainage amount:  Copious   Wound treatment:  Wound left open   Packing materials:  None Post-procedure details:    Patient tolerance of procedure:  Tolerated well, no immediate complications   (including critical care time)  Medications Ordered in ED Medications  fentaNYL (SUBLIMAZE) injection 50 mcg (50 mcg Nasal Given 01/29/17 0035)  bupivacaine-epinephrine (MARCAINE W/ EPI) 0.5% -1:200000 injection 1.8 mL (1.8 mLs Infiltration Given by Other 01/29/17 0507)  fentaNYL (SUBLIMAZE) injection 100 mcg (100 mcg Intravenous Given 01/29/17 0508)  ondansetron (ZOFRAN) injection 4 mg (4 mg Intravenous Given 01/29/17 0508)  clindamycin (CLEOCIN) IVPB 900 mg (900 mg Intravenous New Bag/Given 01/29/17 0516)     Initial Impression / Assessment and Plan / ED Course  I have reviewed the triage vital signs and the nursing notes.  Pertinent labs & imaging results that were available during my care of the patient were reviewed by me and considered in my medical decision making (see chart for  details).     41 yo M With a chief complaint of dental abscess. Large fluctuant abscess is noted on physical exam. With this I&D. If I am unable then he likely needs imaging and IV antibiotics.  Able to I&D at bedside. Large amount of purulent drainage. Will change antibiotic to clindamycin. Discharge home.  5:48 AM:  I have discussed the diagnosis/risks/treatment options with the patient and family and believe the pt to be eligible for discharge home to follow-up with PCP. We also discussed returning to the ED immediately if new or worsening sx occur. We discussed the sx which are most concerning (e.g., sudden worsening pain, fever, inability to tolerate by mouth) that necessitate immediate return. Medications administered to the patient during their visit and any new prescriptions provided to the patient are listed below.  Medications given during this visit Medications  fentaNYL (SUBLIMAZE) injection 50 mcg (50 mcg Nasal Given 01/29/17 0035)  bupivacaine-epinephrine (MARCAINE W/ EPI) 0.5% -1:200000 injection 1.8 mL (1.8 mLs Infiltration Given by Other 01/29/17 0507)  fentaNYL (SUBLIMAZE) injection 100 mcg (100 mcg Intravenous Given 01/29/17 0508)  ondansetron (ZOFRAN) injection 4 mg (4 mg Intravenous Given 01/29/17 0508)  clindamycin (CLEOCIN) IVPB 900 mg (900 mg Intravenous New Bag/Given 01/29/17 0516)     The patient appears reasonably screen and/or stabilized for discharge and I doubt any other medical condition or other [facility] requiring further screening, evaluation, or treatment in the ED at this time prior to discharge.    Final Clinical Impressions(s) / ED Diagnoses   Final diagnoses:  Dental abscess    New Prescriptions New Prescriptions   CLINDAMYCIN (CLEOCIN) 150 MG CAPSULE    Take 3 capsules (450 mg total) by mouth 3 (three) times daily. X 7 days     Melene Plan, [place] 01/29/17 [phone]

## 2017-01-29 NOTE — Discharge Instructions (Signed)
Take 4 over the counter ibuprofen tablets 3 times a day or 2 over-the-counter naproxen tablets twice a day for pain.(or your diclofenac) Also take tylenol 1000mg (2 extra strength) four times a day.

## 2017-02-26 ENCOUNTER — Encounter (HOSPITAL_COMMUNITY): Payer: Self-pay | Admitting: Emergency Medicine

## 2017-02-26 ENCOUNTER — Emergency Department (HOSPITAL_COMMUNITY)
Admission: EM | Admit: 2017-02-26 | Discharge: 2017-02-26 | Disposition: A | Discharge status: Home / Self Care | Payer: Self-pay | Attending: Emergency Medicine | Admitting: Emergency Medicine

## 2017-02-26 DIAGNOSIS — E119 Type 2 diabetes mellitus without complications: Secondary | ICD-10-CM | POA: Insufficient documentation

## 2017-02-26 DIAGNOSIS — T7840XA Allergy, unspecified, initial encounter: Secondary | ICD-10-CM | POA: Insufficient documentation

## 2017-02-26 DIAGNOSIS — Z79899 Other long term (current) drug therapy: Secondary | ICD-10-CM | POA: Insufficient documentation

## 2017-02-26 DIAGNOSIS — R21 Rash and other nonspecific skin eruption: Secondary | ICD-10-CM | POA: Insufficient documentation

## 2017-02-26 DIAGNOSIS — Z7984 Long term (current) use of oral hypoglycemic drugs: Secondary | ICD-10-CM | POA: Insufficient documentation

## 2017-02-26 DIAGNOSIS — Z87891 Personal history of nicotine dependence: Secondary | ICD-10-CM | POA: Insufficient documentation

## 2017-02-26 DIAGNOSIS — H02846 Edema of left eye, unspecified eyelid: Secondary | ICD-10-CM | POA: Insufficient documentation

## 2017-02-26 DIAGNOSIS — I10 Essential (primary) hypertension: Secondary | ICD-10-CM | POA: Insufficient documentation

## 2017-02-26 MED ORDER — DIPHENHYDRAMINE HCL 25 MG PO CAPS
25.0000 mg | ORAL_CAPSULE | Freq: Once | ORAL | Status: AC
  Administered 2017-02-26: 25 mg via ORAL
  Filled 2017-02-26: qty 1

## 2017-02-26 MED ORDER — FAMOTIDINE 20 MG PO TABS
20.0000 mg | ORAL_TABLET | Freq: Once | ORAL | Status: AC
  Administered 2017-02-26: 20 mg via ORAL
  Filled 2017-02-26: qty 1

## 2017-02-26 MED ORDER — PREDNISONE 20 MG PO TABS
60.0000 mg | ORAL_TABLET | Freq: Once | ORAL | Status: AC
  Administered 2017-02-26: 60 mg via ORAL
  Filled 2017-02-26: qty 3

## 2017-02-26 MED ORDER — FAMOTIDINE 20 MG PO TABS: 20.0000 mg | ORAL_TABLET | Freq: Two times a day (BID) | ORAL | 0 refills | Status: DC

## 2017-02-26 NOTE — ED Triage Notes (Signed)
Patient c/o bilateral hand swelling, right elbow swelling, and bilateral eye swelling since last night. Speaking in full sentences without difficulty. Denies SOB, throat, or tongue swelling. Reports trying PO benadryl with no relief. Reports no known allergies.

## 2017-02-26 NOTE — Discharge Instructions (Signed)
Please read instructions below. Take  of benadryl (1 tablet) every 6 hours, AND  of pepcid every 12 hours for rash and itching.  Schedule an appointment with primary care to follow up on your symptoms. Return to the ER immediately for feeling your throat closing, swelling of your lips or tongue, difficulty breathing, or new or concerning symptoms.

## 2017-02-26 NOTE — ED Provider Notes (Signed)
[facility]-EMERGENCY DEPT Provider Note   CSN: [id number] Arrival date & time: 02/26/17  1203     History   Chief Complaint Chief Complaint  Patient presents with  . Facial Swelling    HPI Adam Moyer is a 41 y.o. male with past medical history of diabetes mellitus, hypertension, presenting to the ED for acute onset of swelling to extremities and eyelids. Patient states last night he thought he had a insect bite on his right anterior shin, that was itchy however the spot disappeared and he began having itching on his hands. He reports taking 2 Benadryl tablets before bed, however woke up with same symptoms. He states itchy "bumps" have been moving around his body, with bilateral eyelid swelling that began earlier today. He reports today he tried topical anti-itch cream on his hands however that just caused his hands to burn. He denies known insect bites or tick bites. No new foods or personal products tried. No known allergies. He denies swelling of the lips or tongue, difficulty swallowing or breathing, shortness of breath, chest pain, abdominal pain, nausea,or any other symptoms today.  The history is provided by the patient.    Past Medical History:  Diagnosis Date  . Diabetes mellitus   . Hypertension     There are no active problems to display for this patient.   History reviewed. No pertinent surgical history.     Home Medications    Prior to Admission medications   Medication Sig Start Date End Date Taking? Authorizing Provider  diphenhydrAMINE (BENADRYL) 25 mg capsule Take 50 mg by mouth every 6 (six) hours as needed.   Yes [provider]  lisinopril-hydrochlorothiazide (PRINZIDE,ZESTORETIC) 20-12.5 MG per tablet Take 1 tablet by mouth daily. 11/11/11  Yes Linwood Dibbles, MD  metFORMIN (GLUCOPHAGE) 500 MG tablet Take 1 tablet (500 mg total) by mouth 2 (two) times daily with a meal. 11/11/11  Yes Linwood Dibbles, MD  famotidine (PEPCID) 20 MG tablet Take 1 tablet (20 mg  total) by mouth 2 (two) times daily. 02/26/17   Russo, [place] N, PA-C    Family History No family history on file.  Social History Social History  Substance Use Topics  . Smoking status: Former Smoker    Quit date: 10/11/2012  . Smokeless tobacco: Not on file  . Alcohol use Yes     Comment: occasional     Allergies   Patient has no known allergies.   Review of Systems Review of Systems  Constitutional: Negative for chills and fever.  HENT: Positive for facial swelling. Negative for trouble swallowing.   Eyes: Negative for visual disturbance.  Respiratory: Negative for apnea, cough, choking, chest tightness, shortness of breath and stridor.   Cardiovascular: Negative for chest pain.  Gastrointestinal: Negative for abdominal pain and nausea.  Skin: Positive for rash.     Physical Exam Updated Vital Signs BP 133/79 (BP Location: Left Arm)   Pulse 75   Temp 98.4 F (36.9 C) (Oral)   Resp 18   Ht 6' (1.829 m)   Wt 108.9 kg (240 lb)   SpO2 100%   BMI 32.55 kg/m   Physical Exam  Constitutional: He appears well-developed and well-nourished. No distress.  Well-appearing, tolerating secretions.  HENT:  Head: Normocephalic and atraumatic.  Mouth/Throat: Oropharynx is clear and moist.  No edema of lips or tongue. No uvula swelling or oropharyngeal swelling. Normal eyelids with edema and mild erythema. No discharge.  Eyes: Pupils are equal, round, and reactive to light. Conjunctivae  and EOM are normal.  Neck: Normal range of motion. Neck supple. No tracheal deviation present.  Cardiovascular: Normal rate, regular rhythm, normal heart sounds and intact distal pulses.  Exam reveals no friction rub.   No murmur heard. Pulmonary/Chest: Effort normal and breath sounds normal. No stridor. No respiratory distress. He has no wheezes. He has no rales. He exhibits no tenderness.  No increased work of breathing.  Abdominal: Soft. Bowel sounds are normal. He exhibits no distension.  There is no tenderness. There is no rebound and no guarding.  Lymphadenopathy:    He has no cervical adenopathy.  Skin:  Bilateral dorsal hands with multiple erythematous wheals. No burrows, vesicles, or bulla. No purpura or petechia. Right lateral elbow with erythematous wheal. No punctate lesions in center. No other rash noted to anterior RLE.   Psychiatric: He has a normal mood and affect. His behavior is normal.  Nursing note and vitals reviewed.    ED Treatments / Results  Labs (all labs ordered are listed, but only abnormal results are displayed) Labs Reviewed - No data to display  EKG  EKG Interpretation None       Radiology No results found.  Procedures Procedures (including critical care time)  Medications Ordered in ED Medications  diphenhydrAMINE (BENADRYL) capsule 25 mg (25 mg Oral Given 02/26/17 1332)  famotidine (PEPCID) tablet 20 mg (20 mg Oral Given 02/26/17 1332)  predniSONE (DELTASONE) tablet 60 mg (60 mg Oral Given 02/26/17 1332)     Initial Impression / Assessment and Plan / ED Course  I have reviewed the triage vital signs and the nursing notes.  Pertinent labs & imaging results that were available during my care of the patient were reviewed by me and considered in my medical decision making (see chart for details).     Patient with presentation consistent with allergic reaction. Multiple itching wheals on extremities, with edema of eyelids. No edema of lips/tongue. No difficulty breathing or swallowing. Pt well-appearing, tolerating secretions, hemodynamically stable. Pt treated w PO benadryl, pepcid and prednisone. Pt re-evaluated prior to dc, is hemodynamically stable, in no respiratory distress, and denies the feeling of throat closing. Pt has been advised to take OTC benadryl & return to the ED if they have a mod-severe allergic rxn (s/s including throat closing, difficulty breathing, swelling of lips face or tongue). Pt is to follow up with their  PCP/urgent care. Pt is safe for discharge.  Discussed results, findings, treatment and follow up. Patient advised of return precautions. Patient verbalized understanding and agreed with plan.  Final Clinical Impressions(s) / ED Diagnoses   Final diagnoses:  Allergic reaction, initial encounter    New Prescriptions Discharge Medication List as of 02/26/2017  2:43 PM    START taking these medications   Details  famotidine (PEPCID) 20 MG tablet Take 1 tablet (20 mg total) by mouth 2 (two) times daily., Starting Sun 02/26/2017, Print         Timothy Lasso, [place], PA-C 02/26/17 1516    Jacalyn Lefevre, MD 02/26/17 [id number]

## 2017-02-28 ENCOUNTER — Emergency Department (HOSPITAL_COMMUNITY)
Admission: EM | Admit: 2017-02-28 | Discharge: 2017-02-28 | Disposition: A | Discharge status: Home / Self Care | Payer: Self-pay | Attending: Emergency Medicine | Admitting: Emergency Medicine

## 2017-02-28 DIAGNOSIS — Z7984 Long term (current) use of oral hypoglycemic drugs: Secondary | ICD-10-CM | POA: Insufficient documentation

## 2017-02-28 DIAGNOSIS — I10 Essential (primary) hypertension: Secondary | ICD-10-CM | POA: Insufficient documentation

## 2017-02-28 DIAGNOSIS — Z87891 Personal history of nicotine dependence: Secondary | ICD-10-CM | POA: Insufficient documentation

## 2017-02-28 DIAGNOSIS — T783XXD Angioneurotic edema, subsequent encounter: Secondary | ICD-10-CM | POA: Insufficient documentation

## 2017-02-28 DIAGNOSIS — E119 Type 2 diabetes mellitus without complications: Secondary | ICD-10-CM | POA: Insufficient documentation

## 2017-02-28 DIAGNOSIS — T783XXA Angioneurotic edema, initial encounter: Secondary | ICD-10-CM | POA: Insufficient documentation

## 2017-02-28 MED ORDER — METHYLPREDNISOLONE 4 MG PO TBPK: ORAL_TABLET | ORAL | 0 refills | Status: DC

## 2017-02-28 MED ORDER — PREDNISONE 20 MG PO TABS
60.0000 mg | ORAL_TABLET | Freq: Once | ORAL | Status: AC
  Administered 2017-02-28: 60 mg via ORAL
  Filled 2017-02-28: qty 3

## 2017-02-28 NOTE — ED Provider Notes (Signed)
[facility]-EMERGENCY DEPT Provider Note   CSN: [id number] Arrival date & time: 02/28/17  1000     History   Chief Complaint Chief Complaint  Patient presents with  . Rash    HPI Adam Moyer is a 41 y.o. male. Chief complaint is painful swelling on the legs. Difficulty swallowing this morning  HPI: This is a wonderfully pleasant 41 year old male. Seen and evaluated 2 years ago with swelling of his eyelids. Treated like I GE mediated allergic reaction. Given Benadryl and steroids. He states his symptoms improved. He's had several areas of his legs and developed aching and itching. He also states this is morning he awakened and was having trouble swallowing secretions. This has since resolved he is eating and drinking fine now. The swelling of his eyelids present. Days ago has also resolved. He takes lisinopril. He has for several years. He continues taking it now.  Past Medical History:  Diagnosis Date  . Diabetes mellitus   . Hypertension     There are no active problems to display for this patient.   No past surgical history on file.     Home Medications    Prior to Admission medications   Medication Sig Start Date End Date Taking? Authorizing Provider  diphenhydrAMINE (BENADRYL) 25 mg capsule Take 50 mg by mouth every 6 (six) hours as needed for itching or allergies (swelling of eyes).    Yes [provider]  famotidine (PEPCID) 20 MG tablet Take 1 tablet (20 mg total) by mouth 2 (two) times daily. 02/26/17  Yes Russo, [place] N, PA-C  metFORMIN (GLUCOPHAGE) 500 MG tablet Take 1 tablet (500 mg total) by mouth 2 (two) times daily with a meal. 11/11/11  Yes Linwood Dibbles, MD  methylPREDNISolone (MEDROL DOSEPAK) 4 MG TBPK tablet 6 po on day 1, decrease by 1 tab per day 02/28/17   Rolland Porter, MD    Family History No family history on file.  Social History Social History  Substance Use Topics  . Smoking status: Former Smoker    Quit date: 10/11/2012  . Smokeless tobacco:  Not on file  . Alcohol use Yes     Comment: occasional     Allergies   Patient has no known allergies.   Review of Systems Review of Systems  Constitutional: Negative for appetite change, chills, diaphoresis, fatigue and fever.  HENT: Negative for mouth sores, sore throat and trouble swallowing.   Eyes: Negative for visual disturbance.  Respiratory: Negative for cough, chest tightness, shortness of breath and wheezing.   Cardiovascular: Negative for chest pain.  Gastrointestinal: Negative for abdominal distention, abdominal pain, diarrhea, nausea and vomiting.  Endocrine: Negative for polydipsia, polyphagia and polyuria.  Genitourinary: Negative for dysuria, frequency and hematuria.  Musculoskeletal: Positive for arthralgias. Negative for gait problem.  Skin: Positive for rash. Negative for color change and pallor.  Neurological: Negative for dizziness, syncope, light-headedness and headaches.  Hematological: Does not bruise/bleed easily.  Psychiatric/Behavioral: Negative for behavioral problems and confusion.     Physical Exam Updated Vital Signs BP (!) 120/98 (BP Location: Left Arm)   Pulse 81   Temp 99.3 F (37.4 C)   Resp 20   SpO2 100%   Physical Exam  Constitutional: He is oriented to person, place, and time. He appears well-developed and well-nourished. No distress.  HENT:  Head: Normocephalic.  HEENT exam normal. No eyelid swelling. Pharynx clear.  No swelling or lip or tongue.  Eyes: Pupils are equal, round, and reactive to light. Conjunctivae are  normal. No scleral icterus.  Neck: Normal range of motion. Neck supple. No thyromegaly present.  Cardiovascular: Normal rate and regular rhythm.  Exam reveals no gallop and no friction rub.   No murmur heard. Pulmonary/Chest: Effort normal and breath sounds normal. No respiratory distress. He has no wheezes. He has no rales.  Abdominal: Soft. Bowel sounds are normal. He exhibits no distension. There is no tenderness.  There is no rebound.  Musculoskeletal: Normal range of motion.  Neurological: He is alert and oriented to person, place, and time.  Skin: Skin is warm and dry. No rash noted.  Areas of well-demarcated round induration of the skin of the bilateral lower extremities consistent with localized angioedema.  Psychiatric: He has a normal mood and affect. His behavior is normal.     ED Treatments / Results  Labs (all labs ordered are listed, but only abnormal results are displayed) Labs Reviewed - No data to display  EKG  EKG Interpretation None       Radiology No results found.  Procedures Procedures (including critical care time)  Medications Ordered in ED Medications  predniSONE (DELTASONE) tablet 60 mg (not administered)     Initial Impression / Assessment and Plan / ED Course  I have reviewed the triage vital signs and the nursing notes.  Pertinent labs & imaging results that were available during my care of the patient were reviewed by me and considered in my medical decision making (see chart for details).    Patient is not having abdominal pain nausea or vomiting. His dysphagia has resolved. This is likely continued angioedema secondary to his ACE inhibitor. I told him in no uncertain terms to not take his lisinopril. We'll treat with steroids for the next few days as needed for his itching. Told him that this may be slow to resolve. Recheck here immediately with any presence of covered sensation of swelling of the lips mouth tongue or throat or any difficulty breathing or swallowing. He expresses understanding.  Final Clinical Impressions(s) / ED Diagnoses   Final diagnoses:  Angioedema, initial encounter  Angioedema, subsequent encounter    New Prescriptions New Prescriptions   METHYLPREDNISOLONE (MEDROL DOSEPAK) 4 MG TBPK TABLET    6 po on day 1, decrease by 1 tab per day     Rolland Porter, MD 02/28/17 1951

## 2017-02-28 NOTE — Discharge Instructions (Signed)
Stop taking your lisinopril. He should never take lisinopril or a class of medicine called and ACE inhibitor ever again This can cause angioedema even after several years of usage. It will immediately return with any usage Use Benadryl as needed for itching. Continue the prescription steroid as needed for itching until 24 hours without symptoms. Angioedema is typically slow to resolve. If you develop any swelling of the mouth or throat. Please check here immediately

## 2017-02-28 NOTE — ED Triage Notes (Signed)
Pt is concerned about difficulty swallowing and lip tingling. Multiple swollen areas on areas on arms and legs. Reports burning on hands.  Pt stated that the facial swelling had decreased since he was evaluated 4 days ago for swelling and hives. . Pt continues to take Benadryl and Pepcid as prescribed. Pt denies shortness of breath.

## 2018-09-07 ENCOUNTER — Encounter: Payer: Self-pay | Admitting: Internal Medicine

## 2018-09-07 ENCOUNTER — Ambulatory Visit: Payer: Self-pay | Admitting: Internal Medicine

## 2018-09-07 ENCOUNTER — Other Ambulatory Visit: Payer: Self-pay

## 2018-09-07 VITALS — BP 140/90 | HR 84 | Resp 12 | Ht 71.5 in | Wt 236.0 lb

## 2018-09-07 DIAGNOSIS — I10 Essential (primary) hypertension: Secondary | ICD-10-CM

## 2018-09-07 DIAGNOSIS — Z79899 Other long term (current) drug therapy: Secondary | ICD-10-CM

## 2018-09-07 DIAGNOSIS — E782 Mixed hyperlipidemia: Secondary | ICD-10-CM

## 2018-09-07 DIAGNOSIS — E1142 Type 2 diabetes mellitus with diabetic polyneuropathy: Secondary | ICD-10-CM

## 2018-09-07 MED ORDER — METFORMIN HCL 1000 MG PO TABS: ORAL_TABLET | ORAL | 3 refills | Status: DC

## 2018-09-07 MED ORDER — HYDROCHLOROTHIAZIDE 25 MG PO TABS: ORAL_TABLET | ORAL | 3 refills | Status: DC

## 2018-09-07 MED ORDER — LOSARTAN POTASSIUM 50 MG PO TABS: 50.0000 mg | ORAL_TABLET | Freq: Every day | ORAL | 3 refills | Status: DC

## 2018-09-07 MED ORDER — ATORVASTATIN CALCIUM 20 MG PO TABS: ORAL_TABLET | ORAL | 3 refills | Status: DC

## 2018-09-07 NOTE — Progress Notes (Signed)
Subjective:    Patient ID: Adam Moyer, male   DOB: [birth year], 43 y.o.   MRN: [id number]   HPI   Here to establish  1.  DM:  Diagnosed 10 years ago and weighed about 255 lbs.  He has made lifestyle changes Breads and sweets are his downfalls.  Does not always make sure he is eating high fiber carbs. He thinks his last A1C was 7.1% [facility] visit last year he thinks in the spring.  No diabetic changes.   2.  Hypertension:  Also diagnosed 10 years ago.  Generally 120s over 70s.  3.  Hypercholesterolemia:  Was taking Simvastatin in past and felt off balance with it, so did not take.  Appears that was in 2015.    4.  GERD:  Mild and infrequent:  He is actually taking Prilosec over the counter maybe once monthly.  Current Meds  Medication Sig  . famotidine (PEPCID) 20 MG tablet Take 1 tablet (20 mg total) by mouth 2 (two) times daily.  . hydrochlorothiazide (HYDRODIURIL) 12.5 MG tablet Take by mouth 2 (two) times daily.   Marland Kitchen losartan (COZAAR) 50 MG tablet Take 50 mg by mouth daily.  . metFORMIN (GLUCOPHAGE) 500 MG tablet Take 1 tablet (500 mg total) by mouth 2 (two) times daily with a meal.   Allergies  Allergen Reactions  . Lisinopril Swelling   Past Medical History:  Diagnosis Date  . Diabetes mellitus 2010  . Hyperlipidemia 2017  . Hypertension 2010   History reviewed. No pertinent surgical history.  Family History  Problem Relation Age of Onset  . Diabetes Mother   . Hypertension Mother   . Hyperlipidemia Mother   . Heart disease Father        MI  . Diabetes Sister   . Hyperlipidemia Sister   . Hypertension Sister   . Hypertension Brother   . Hyperlipidemia Brother   . Diabetes Brother     Social History   Socioeconomic History  . Marital status: Married    Spouse name: Adam Moyer  . Number of children: 2  . Years of education: 10  . Highest education level: GED or equivalent  Occupational History  . Occupation: [employer] and  [employer]    Comment: owners  Social Needs  . Financial resource strain: Not hard at all  . Food insecurity:    Worry: Never true    Inability: Never true  . Transportation needs:    Medical: No    Non-medical: No  Tobacco Use  . Smoking status: Former Smoker    Packs/day: 1.00    Years: 17.00    Pack years: 17.00    Types: Cigarettes    Last attempt to quit: 10/12/2015    Years since quitting: 2.9  . Smokeless tobacco: Never Used  Substance and Sexual Activity  . Alcohol use: Yes    Comment: occasional  . Drug use: Yes    Types: Marijuana    Comment: every other day  . Sexual activity: Not on file  Lifestyle  . Physical activity:    Days per week: 7 days    Minutes per session: 60 min  . Stress: Not on file  Relationships  . Social connections:    Talks on phone: Not on file    Gets together: Not on file    Attends religious service: Not on file    Active member of club or organization: Not on file    Attends meetings  of clubs or organizations: Not on file    Relationship status: Married  . Intimate partner violence:    Fear of current or ex partner: No    Emotionally abused: No    Physically abused: No    Forced sexual activity: No  Other Topics Concern  . Not on file  Social History Narrative   Married   Lives at home with his wife a patient here and 2 kids and rabbit, Nipsey     Review of Systems    Objective:   BP 140/90 (BP Location: Left Arm, Patient Position: Sitting, Cuff Size: Normal)   Pulse 84   Resp 12   Ht 5' 11.5" (1.816 m)   Wt 236 lb (107 kg)   BMI 32.46 kg/m   Physical Exam  NAD HEENT:  PERRL, EOMI Neck:  Supple No adenopathy, no thyromegaly Chest:  CTA CV:  RRR with normal S1 and S2, No S3, S4 or murmur.  No carotid bruits.  Carotid, radial and DP pulses normal and equal Abd:  S, NT, No HSM or mass, + BS. LE:  No edema.   Assessment & Plan  1.  DM:  Increase Metformin to 1000 mg twice daily as last A1C not quite at goal.  Pharmacy called after patient left and stated he was already taking Metformin 1000 mg twice daily.  Discussed they should continue that dosing and would change in chart. A1C  2.  Hypertension:  Refill Losartan 50 mg and HCTZ 25 mg daily.   CMP  3.  Hyperlipidemia:  Atorvastatin 20 mg daily.  4.  GERD: OTC Prilosec infrequently Follow up in 6 weeks for FLP, hepatic profile

## 2018-09-08 LAB — COMPREHENSIVE METABOLIC PANEL
ALBUMIN: 4.6 g/dL (ref 4.0–5.0)
ALT: 24 IU/L (ref 0–44)
AST: 22 IU/L (ref 0–40)
Albumin/Globulin Ratio: 1.6 (ref 1.2–2.2)
Alkaline Phosphatase: 51 IU/L (ref 39–117)
BUN/Creatinine Ratio: 10 (ref 9–20)
BUN: 12 mg/dL (ref 6–24)
Bilirubin Total: <0.2 mg/dL (ref 0.0–1.2)
CHLORIDE: 98 mmol/L (ref 96–106)
CO2: 23 mmol/L (ref 20–29)
Calcium: 10 mg/dL (ref 8.7–10.2)
Creatinine, Ser: 1.15 mg/dL (ref 0.76–1.27)
GFR calc Af Amer: 91 mL/min/{1.73_m2} (ref >59)
GFR calc non Af Amer: 79 mL/min/{1.73_m2} (ref >59)
Globulin, Total: 2.9 g/dL (ref 1.5–4.5)
Glucose: 111 mg/dL — ABNORMAL HIGH (ref 65–99)
Potassium: 4.8 mmol/L (ref 3.5–5.2)
Sodium: 139 mmol/L (ref 134–144)
Total Protein: 7.5 g/dL (ref 6.0–8.5)

## 2018-09-08 LAB — HGB A1C W/O EAG: Hgb A1c MFr Bld: 6.8 % — ABNORMAL HIGH (ref 4.8–5.6)

## 2018-10-19 ENCOUNTER — Other Ambulatory Visit: Payer: Self-pay

## 2018-12-06 ENCOUNTER — Other Ambulatory Visit: Payer: Self-pay

## 2018-12-06 DIAGNOSIS — E782 Mixed hyperlipidemia: Secondary | ICD-10-CM

## 2018-12-06 DIAGNOSIS — Z79899 Other long term (current) drug therapy: Secondary | ICD-10-CM

## 2018-12-07 ENCOUNTER — Telehealth (INDEPENDENT_AMBULATORY_CARE_PROVIDER_SITE_OTHER): Payer: Self-pay | Admitting: Internal Medicine

## 2018-12-07 DIAGNOSIS — E782 Mixed hyperlipidemia: Secondary | ICD-10-CM

## 2018-12-07 DIAGNOSIS — E1142 Type 2 diabetes mellitus with diabetic polyneuropathy: Secondary | ICD-10-CM

## 2018-12-07 DIAGNOSIS — I10 Essential (primary) hypertension: Secondary | ICD-10-CM

## 2018-12-07 LAB — LIPID PANEL W/O CHOL/HDL RATIO
Cholesterol, Total: 172 mg/dL (ref 100–199)
HDL: 44 mg/dL (ref >39)
LDL Calculated: 96 mg/dL (ref 0–99)
Triglycerides: 159 mg/dL — ABNORMAL HIGH (ref 0–149)
VLDL Cholesterol Cal: 32 mg/dL (ref 5–40)

## 2018-12-07 LAB — HEPATIC FUNCTION PANEL
ALT: 26 IU/L (ref 0–44)
AST: 20 IU/L (ref 0–40)
Albumin: 4.6 g/dL (ref 4.0–5.0)
Alkaline Phosphatase: 69 IU/L (ref 39–117)
Bilirubin Total: 0.3 mg/dL (ref 0.0–1.2)
Bilirubin, Direct: 0.1 mg/dL (ref 0.00–0.40)
Total Protein: 7.1 g/dL (ref 6.0–8.5)

## 2018-12-07 MED ORDER — ATORVASTATIN CALCIUM 40 MG PO TABS: ORAL_TABLET | ORAL | 3 refills | Status: DC

## 2018-12-07 NOTE — Progress Notes (Signed)
    Subjective:    Patient ID: Adam Moyer, male   DOB: 22-Apr-1976, 43 y.o.   MRN: [id number]   HPI   1.  DM:  A1C was at goal in March.  Sugars running 122-147, lower end in morning. No numbness or tingling in feet. Checking feet nightly. Had eye check in March after last seen.  Reportedly no diabetic changes.  [facility] [address]  2.  Hypertension:  Has not missed Losartan or HCTZ.  Just picked up 90 days.    3.  Hyperlipidemia:  Not at goal.  Has not missed Atorvastatin more than 2 days in last 8 weeks. Lipid Panel     Component Value Date/Time   CHOL 172 12/06/2018 0849   TRIG 159 (H) 12/06/2018 0849   HDL 44 12/06/2018 0849   LDLCALC 96 12/06/2018 0849     Current Meds  Medication Sig  . atorvastatin (LIPITOR) 20 MG tablet 1 tab by mouth with dinner daily  . hydrochlorothiazide (HYDRODIURIL) 25 MG tablet 1 tab by mouth in morning daily with Losartan  . losartan (COZAAR) 50 MG tablet Take 1 tablet (50 mg total) by mouth daily.  . metFORMIN (GLUCOPHAGE) 1000 MG tablet 1/2 tab by mouth twice daily with meals   Allergies  Allergen Reactions  . Lisinopril Swelling     Review of Systems    Objective:   BP (!) 147/91 (BP Location: Left Arm, Patient Position: Sitting, Cuff Size: Normal)   Pulse 86   Physical Exam  Appears well.   Assessment & Plan   1.  DM:  At goal with Metformin 1000 mg twice daily.  2.  Hypertension:  Increase Losartan to 50 mg 2 tabs daily with 25 mg of HCTZ. If this brings him to goal with bp, will send in Rx for Losartan 50/12.5 mg to take 2 tabs daily with 90 tab prescription, which will ultimately be less expensive than individual Rx for the 2 meds. BP check in 4 weeks--nurse visit.  3.  Hyperlipidemia:  Double up on Atorvastatin 20 mg with change to 40 mg tabs in 45 days as also just filled for 90 days. FLP/hepatic profile in 8 weeks and OV with me subsequently.

## 2018-12-07 NOTE — Progress Notes (Signed)
done

## 2019-02-01 ENCOUNTER — Other Ambulatory Visit: Payer: Self-pay

## 2019-02-01 ENCOUNTER — Other Ambulatory Visit (INDEPENDENT_AMBULATORY_CARE_PROVIDER_SITE_OTHER): Payer: Self-pay

## 2019-02-01 DIAGNOSIS — Z79899 Other long term (current) drug therapy: Secondary | ICD-10-CM

## 2019-02-01 DIAGNOSIS — E782 Mixed hyperlipidemia: Secondary | ICD-10-CM

## 2019-02-02 LAB — HEPATIC FUNCTION PANEL
ALT: 39 IU/L (ref 0–44)
AST: 26 IU/L (ref 0–40)
Albumin: 4.6 g/dL (ref 4.0–5.0)
Alkaline Phosphatase: 63 IU/L (ref 39–117)
Bilirubin Total: 0.4 mg/dL (ref 0.0–1.2)
Bilirubin, Direct: 0.11 mg/dL (ref 0.00–0.40)
Total Protein: 7.3 g/dL (ref 6.0–8.5)

## 2019-02-02 LAB — LIPID PANEL W/O CHOL/HDL RATIO
Cholesterol, Total: 146 mg/dL (ref 100–199)
HDL: 40 mg/dL (ref >39)
LDL Calculated: 85 mg/dL (ref 0–99)
Triglycerides: 104 mg/dL (ref 0–149)
VLDL Cholesterol Cal: 21 mg/dL (ref 5–40)

## 2019-02-06 ENCOUNTER — Ambulatory Visit: Payer: Self-pay | Admitting: Internal Medicine

## 2019-04-22 ENCOUNTER — Other Ambulatory Visit: Payer: Self-pay | Admitting: Internal Medicine

## 2019-05-30 ENCOUNTER — Telehealth: Payer: Self-pay | Admitting: Internal Medicine

## 2019-05-30 NOTE — Telephone Encounter (Signed)
Any diet changes? Not really . Pt . States he is eating the same. Eating more carbs? No Any changes in physical activity since your last visit with Dr. Amil Amen? Yes. He is not going to the gym anymore.

## 2019-05-30 NOTE — Telephone Encounter (Signed)
Any diet changes? Eating more carbs? Stressed about something? Any changes in physical activity since your last visit with Dr. Amil Amen?

## 2019-05-30 NOTE — Telephone Encounter (Signed)
Patient called stating has been having 3 weeks with blood sugars running high from 170's to 300's . Patient stated has been taking the whole pill of the Metformin 1000 mg twice a day as told by Dr. Amil Amen and has been checking his sugars before breakfast 170 -175, before dinner 171 and after dinner 255-300's.  Patient requesting advise on what to do next.

## 2019-05-31 NOTE — Telephone Encounter (Signed)
Discussed with Dr. Amil Amen. -Him stopping going to the gym made a difference . Pt advised that if he notices his sugars going up, he needs to think about what he is eating and his physical activity and make some changes on improving it.  Pt. States he stopped going to the gym around March when the pandemic started and his sugar readings just :increased about two moths ago. He only drinks water and states he ate dinner last night around 7:30 pm and he checked his sugars this morning fasting and had a reading of 177. He re checked it again after eating  boiled eggs and palin oatmeal and had a reading of 210

## 2019-06-10 ENCOUNTER — Other Ambulatory Visit: Payer: Self-pay

## 2019-06-10 ENCOUNTER — Telehealth: Payer: Self-pay | Admitting: Internal Medicine

## 2019-06-10 MED ORDER — LOSARTAN POTASSIUM 50 MG PO TABS: 50.0000 mg | ORAL_TABLET | Freq: Every day | ORAL | 0 refills | Status: DC

## 2019-06-10 NOTE — Telephone Encounter (Signed)
Pt. Called requesting Losartan 50 MG tablet to be sent to [facility] on [address]- Per Cherice  Pt. Only getting 30 days and needs to follow up. Pt. Scheduled for follow up appointment 07/08/2019 at 2pm. Pt. Aware of no further refills until he is seen at the office

## 2019-07-02 ENCOUNTER — Other Ambulatory Visit: Payer: Self-pay

## 2019-07-02 ENCOUNTER — Other Ambulatory Visit: Payer: Self-pay | Admitting: Internal Medicine

## 2019-07-02 ENCOUNTER — Telehealth: Payer: Self-pay | Admitting: Internal Medicine

## 2019-07-02 MED ORDER — LOSARTAN POTASSIUM 50 MG PO TABS: ORAL_TABLET | ORAL | 0 refills | Status: DC

## 2019-07-02 NOTE — Telephone Encounter (Signed)
Pt. Requesting Losartan refill to be sent over to [facility] on [facility]. Pt . States Dr.Mulberry increased his dose to 50mg  tablets two times a day on his last visit. [facility] dispensed only 30 tablets and he doesn't have any more left until his follow up appointment on Monday, Jan. 25th at 2:00pm.   Please advise .[id number]

## 2019-07-02 NOTE — Telephone Encounter (Signed)
Rx sent to pharmacy   

## 2019-07-08 ENCOUNTER — Ambulatory Visit: Payer: Self-pay | Admitting: Internal Medicine

## 2019-07-11 ENCOUNTER — Other Ambulatory Visit: Payer: Self-pay

## 2019-07-11 ENCOUNTER — Encounter: Payer: Self-pay | Admitting: Internal Medicine

## 2019-07-11 ENCOUNTER — Ambulatory Visit (INDEPENDENT_AMBULATORY_CARE_PROVIDER_SITE_OTHER): Payer: Self-pay | Admitting: Internal Medicine

## 2019-07-11 VITALS — BP 118/80 | HR 86 | Resp 12 | Ht 71.5 in | Wt 234.0 lb

## 2019-07-11 DIAGNOSIS — E782 Mixed hyperlipidemia: Secondary | ICD-10-CM

## 2019-07-11 DIAGNOSIS — I10 Essential (primary) hypertension: Secondary | ICD-10-CM

## 2019-07-11 DIAGNOSIS — Z23 Encounter for immunization: Secondary | ICD-10-CM

## 2019-07-11 DIAGNOSIS — E1142 Type 2 diabetes mellitus with diabetic polyneuropathy: Secondary | ICD-10-CM

## 2019-07-11 LAB — GLUCOSE, POCT (MANUAL RESULT ENTRY): POC Glucose: 184 mg/dl — AB (ref 70–99)

## 2019-07-11 MED ORDER — LOSARTAN POTASSIUM 100 MG PO TABS: ORAL_TABLET | ORAL | 3 refills | Status: DC

## 2019-07-11 NOTE — Progress Notes (Signed)
    Subjective:    Patient ID: Adam Moyer, male   DOB: 1976-04-14, 44 y.o.   MRN: [id number]   HPI   1.  Hyperlipidemia:  Is taking Atorvastatin 40 mg daily.  Does walk, but does not really do what he should.  2.  DM:  Eating a lot of fruits.  Sugars running high 100s to low 200.  A1C was good in March. Feet itch and burn. No eye exam in past year. Actually taking Metformin 1000 mg twice daily.  3.  Hypertension:  States taking HCTZ and Losartan.  Current Meds  Medication Sig  . atorvastatin (LIPITOR) 40 MG tablet 1 tab by mouth with dinner daily  . hydrochlorothiazide (HYDRODIURIL) 25 MG tablet 1 tab by mouth in morning daily with Losartan  . losartan (COZAAR) 50 MG tablet 2 daily  . metFORMIN (GLUCOPHAGE) 1000 MG tablet 1/2 tab by mouth twice daily with meals    Actually taking Metformin 1000 mg twice daily.   Allergies  Allergen Reactions  . Lisinopril Swelling     Review of Systems    Objective:   BP 118/80 (BP Location: Left Arm, Patient Position: Sitting, Cuff Size: Large)   Pulse 86   Resp 12   Ht 5' 11.5" (1.816 m)   Wt 234 lb (106.1 kg)   BMI 32.18 kg/m   Physical Exam  NAD Neck:  Supple, No adenopathy, no thyromegaly Chest:  CTA CV:  RRR without murmur or rub.  Radial and DP pulses normal and equal Abd:  S, NT, No HSM or mass + BS\ LE:  No edema.  Diabetic foot exam was performed with the following findings:   No deformities, ulcerations, or other skin breakdown Normal sensation of 10g monofilament Intact posterior tibialis and dorsalis pedis pulses     Assessment & Plan  1.  Hyperlipidemia:  Lipids were close to goal in August, but not doing well with lifestyle.  Encouraged picking a goal a week to work on with diet and physical activity.  To take meds with meals as well.  2.  DM:  Suspect sugars not nearly as good as last check.  A1C.  As above.  Not to skip breakfast--eat with his son. Referral for eye exam. Refused pneumococcal  vaccine--gave VIS info to evaluate.  3.  Peripheral neuropathy, though foot exam normal today.  Perhaps due to higher blood glucose.  If as he gets his sugars under better control, this does not improve, to call. Did not want pneumococcal--given info regarding.  4.  HM:  Tdap today. 4.  Hypertension:  Controlled.

## 2019-07-11 NOTE — Patient Instructions (Signed)
Your one goal is to eat breakfast with son--a good one. And be done with dinner by 8 pm Take your meds with meals. Call if your feet worsen

## 2019-07-12 LAB — HGB A1C W/O EAG: Hgb A1c MFr Bld: 8.2 % — ABNORMAL HIGH (ref 4.8–5.6)

## 2019-08-05 MED ORDER — METFORMIN HCL 1000 MG PO TABS: ORAL_TABLET | ORAL | 3 refills | Status: DC

## 2019-08-25 ENCOUNTER — Other Ambulatory Visit: Payer: Self-pay | Admitting: Internal Medicine

## 2019-10-11 ENCOUNTER — Other Ambulatory Visit: Payer: Self-pay

## 2019-10-15 ENCOUNTER — Ambulatory Visit: Payer: Self-pay | Admitting: Internal Medicine

## 2019-12-12 ENCOUNTER — Other Ambulatory Visit: Payer: Self-pay | Admitting: Internal Medicine

## 2020-01-12 ENCOUNTER — Other Ambulatory Visit: Payer: Self-pay | Admitting: Internal Medicine

## 2020-05-17 ENCOUNTER — Other Ambulatory Visit: Payer: Self-pay | Admitting: Internal Medicine

## 2020-07-07 ENCOUNTER — Other Ambulatory Visit: Payer: Self-pay | Admitting: Internal Medicine

## 2020-08-03 ENCOUNTER — Telehealth: Payer: Self-pay | Admitting: Internal Medicine

## 2020-08-03 NOTE — Telephone Encounter (Signed)
Patient called requesting a refill for his Rx of atorvastatin (LIPITOR) 40 MG tablet, hydrochlorothiazide (HYDRODIURIL) 25 MG tablet, losartan (COZAAR) 50 MG tablet, metFORMIN (GLUCOPHAGE) 1000 MG tablet. Patient uses the pharmacy at [facility].

## 2020-08-05 ENCOUNTER — Other Ambulatory Visit: Payer: Self-pay | Admitting: Internal Medicine

## 2020-08-06 MED ORDER — HYDROCHLOROTHIAZIDE 25 MG PO TABS: ORAL_TABLET | ORAL | 1 refills | Status: DC

## 2020-08-06 MED ORDER — ATORVASTATIN CALCIUM 40 MG PO TABS: 40.0000 mg | ORAL_TABLET | Freq: Every day | ORAL | 1 refills | Status: DC

## 2020-08-06 NOTE — Addendum Note (Signed)
Addended by: Marcene Duos on: 08/06/2020 05:28 PM   Modules accepted: Orders

## 2020-08-07 ENCOUNTER — Other Ambulatory Visit: Payer: Self-pay | Admitting: Internal Medicine

## 2020-08-07 NOTE — Telephone Encounter (Signed)
Notified patient of Rx refills. Patient is aware that he cannot miss his appointment or Doctor will not send more refills. Patient verbally expressed understanding.

## 2020-08-28 ENCOUNTER — Other Ambulatory Visit: Payer: Self-pay | Admitting: Internal Medicine

## 2020-10-02 ENCOUNTER — Other Ambulatory Visit: Payer: Self-pay | Admitting: Internal Medicine

## 2020-12-27 ENCOUNTER — Other Ambulatory Visit: Payer: Self-pay | Admitting: Internal Medicine

## 2021-01-05 ENCOUNTER — Other Ambulatory Visit: Payer: Self-pay

## 2021-01-05 ENCOUNTER — Ambulatory Visit: Payer: Self-pay | Admitting: Internal Medicine

## 2021-01-05 ENCOUNTER — Encounter: Payer: Self-pay | Admitting: Internal Medicine

## 2021-01-05 VITALS — BP 132/90 | HR 72 | Resp 12 | Ht 71.5 in | Wt 234.0 lb

## 2021-01-05 DIAGNOSIS — E1142 Type 2 diabetes mellitus with diabetic polyneuropathy: Secondary | ICD-10-CM

## 2021-01-05 DIAGNOSIS — E782 Mixed hyperlipidemia: Secondary | ICD-10-CM

## 2021-01-05 DIAGNOSIS — Z79899 Other long term (current) drug therapy: Secondary | ICD-10-CM

## 2021-01-05 DIAGNOSIS — I10 Essential (primary) hypertension: Secondary | ICD-10-CM

## 2021-01-05 MED ORDER — LOSARTAN POTASSIUM 50 MG PO TABS: ORAL_TABLET | ORAL | 3 refills | Status: DC

## 2021-01-05 MED ORDER — HYDROCHLOROTHIAZIDE 25 MG PO TABS: ORAL_TABLET | ORAL | 3 refills | Status: DC

## 2021-01-05 MED ORDER — METFORMIN HCL 1000 MG PO TABS: 1000.0000 mg | ORAL_TABLET | Freq: Two times a day (BID) | ORAL | 3 refills | Status: DC

## 2021-01-05 MED ORDER — AMLODIPINE BESYLATE 5 MG PO TABS: 5.0000 mg | ORAL_TABLET | Freq: Every day | ORAL | 11 refills | Status: DC

## 2021-01-05 MED ORDER — ATORVASTATIN CALCIUM 40 MG PO TABS: 40.0000 mg | ORAL_TABLET | Freq: Every day | ORAL | 3 refills | Status: DC

## 2021-01-05 NOTE — Progress Notes (Signed)
    Subjective:    Patient ID: Adam Moyer, male   DOB: [birth year], 45 y.o.   MRN: [id number]   HPI  Here after not being seen since 06/2019.   Hypertension:  Taking Losartan 100 mg total and HCTZ 25 mg daily.  States he is not missing his meds.  Was out a few months ago, but for only 5 days.    2.  DM:  Last A1C was 8.2% in Jan 2021.  Not checking sugars frequently.  Generally, 150 or less.  Can be fasting or evening prior to bedtime.  No numbness or tingling in hands or feet.   No polyuria or polydipsia, but drinks a lot of water regularly.   No eye check in past year.  No chest pain or dyspnea.  3.  Hyperlipidemia:  Taking Atorvastatin regularly.   4.  HM:  Has not had COVID vaccine.  Discussed at length.  He also has not had Pneumococcal 23 v and after discussion, also declines.  Current Meds  Medication Sig   atorvastatin (LIPITOR) 40 MG tablet Take 1 tablet (40 mg total) by mouth daily with supper.   diphenhydrAMINE (BENADRYL) 25 mg capsule Take 50 mg by mouth every 6 (six) hours as needed for itching or allergies (swelling of eyes).    hydrochlorothiazide (HYDRODIURIL) 25 MG tablet Take 1 tablet by mouth once daily   losartan (COZAAR) 50 MG tablet TAKE 2 TABLETS BY MOUTH ONCE DAILY   metFORMIN (GLUCOPHAGE) 1000 MG tablet TAKE 1 TABLET BY MOUTH TWICE DAILY WITH MEALS   Allergies  Allergen Reactions   Lisinopril Swelling     Review of Systems    Objective:   BP (!) 140/100 (BP Location: Left Arm, Patient Position: Sitting, Cuff Size: Normal)   Pulse 72   Resp 12   Ht 5' 11.5" (1.816 m)   Wt 234 lb (106.1 kg)   BMI 32.18 kg/m   Physical Exam NAD HEENT:  PERRL, EOMI,throat without injection Neck:  Supple, No adenopathy, no thyromegaly Chest:  CTA CV:  RRR with normal S1 and S2, No S3, S4 or murmur.  No carotid bruits.  Carotid, radial and DP pulses normal and equal Abd:  S, NT, No HSM or mass, + BS LE:  No edema.  Diabetic foot exam was performed with the  following findings:   Normal sensation of 10g monofilament Intact posterior tibialis and dorsalis pedis pulses Bunions, Right >>Left     Assessment & Plan    Hypertension:  add low dose Amlodipine 5 mg daily.  Follow up in 2 weeks with bp and pulse check.  CMP today.  2.  DM:  Suspect not controlled.  A1C and urine microalbumin/crea.  Metformin only for now.  Referral for diabetic eye exam.    3.  HM:  refuses all vaccines today including COVID, pneumococcal.  Does not want HIV screening.  Tdap up to date.  Encouraged influenza vaccine in September.  4.  Hyperlipidemia:  FLP.

## 2021-01-06 LAB — CBC WITH DIFFERENTIAL/PLATELET
Basophils Absolute: 0 10*3/uL (ref 0.0–0.2)
Basos: 0 %
EOS (ABSOLUTE): 0.3 10*3/uL (ref 0.0–0.4)
Eos: 4 %
Hematocrit: 42.7 % (ref 37.5–51.0)
Hemoglobin: 13.6 g/dL (ref 13.0–17.7)
Immature Grans (Abs): 0 10*3/uL (ref 0.0–0.1)
Immature Granulocytes: 0 %
Lymphocytes Absolute: 2.3 10*3/uL (ref 0.7–3.1)
Lymphs: 28 %
MCH: 26 pg — ABNORMAL LOW (ref 26.6–33.0)
MCHC: 31.9 g/dL (ref 31.5–35.7)
MCV: 82 fL (ref 79–97)
Monocytes Absolute: 0.4 10*3/uL (ref 0.1–0.9)
Monocytes: 4 %
Neutrophils Absolute: 5.3 10*3/uL (ref 1.4–7.0)
Neutrophils: 64 %
Platelets: 356 10*3/uL (ref 150–450)
RBC: 5.24 x10E6/uL (ref 4.14–5.80)
RDW: 13.8 % (ref 11.6–15.4)
WBC: 8.3 10*3/uL (ref 3.4–10.8)

## 2021-01-06 LAB — COMPREHENSIVE METABOLIC PANEL
ALT: 26 IU/L (ref 0–44)
AST: 20 IU/L (ref 0–40)
Albumin/Globulin Ratio: 2 (ref 1.2–2.2)
Albumin: 4.8 g/dL (ref 4.0–5.0)
Alkaline Phosphatase: 81 IU/L (ref 44–121)
BUN/Creatinine Ratio: 9 (ref 9–20)
BUN: 10 mg/dL (ref 6–24)
Bilirubin Total: 0.3 mg/dL (ref 0.0–1.2)
CO2: 25 mmol/L (ref 20–29)
Calcium: 9.8 mg/dL (ref 8.7–10.2)
Chloride: 100 mmol/L (ref 96–106)
Creatinine, Ser: 1.17 mg/dL (ref 0.76–1.27)
Globulin, Total: 2.4 g/dL (ref 1.5–4.5)
Glucose: 125 mg/dL — ABNORMAL HIGH (ref 65–99)
Potassium: 4.1 mmol/L (ref 3.5–5.2)
Sodium: 139 mmol/L (ref 134–144)
Total Protein: 7.2 g/dL (ref 6.0–8.5)
eGFR: 79 mL/min/{1.73_m2} (ref >59)

## 2021-01-06 LAB — HGB A1C W/O EAG: Hgb A1c MFr Bld: 8.2 % — ABNORMAL HIGH (ref 4.8–5.6)

## 2021-01-06 LAB — MICROALBUMIN / CREATININE URINE RATIO
Creatinine, Urine: 38.8 mg/dL
Microalb/Creat Ratio: <8 mg/g creat (ref 0–29)
Microalbumin, Urine: <3 ug/mL

## 2021-01-06 LAB — LIPID PANEL W/O CHOL/HDL RATIO
Cholesterol, Total: 148 mg/dL (ref 100–199)
HDL: 44 mg/dL (ref >39)
LDL Chol Calc (NIH): 86 mg/dL (ref 0–99)
Triglycerides: 94 mg/dL (ref 0–149)
VLDL Cholesterol Cal: 18 mg/dL (ref 5–40)

## 2021-01-19 ENCOUNTER — Other Ambulatory Visit: Payer: Self-pay

## 2021-01-19 ENCOUNTER — Ambulatory Visit (INDEPENDENT_AMBULATORY_CARE_PROVIDER_SITE_OTHER): Payer: Self-pay | Admitting: Internal Medicine

## 2021-01-19 DIAGNOSIS — I10 Essential (primary) hypertension: Secondary | ICD-10-CM

## 2021-01-19 MED ORDER — GLIPIZIDE 5 MG PO TABS: ORAL_TABLET | ORAL | 11 refills | Status: DC

## 2021-01-19 NOTE — Progress Notes (Signed)
    Subjective:    Patient ID: Adam Moyer, male   DOB: [birth year], 45 y.o.   MRN: [id number]   HPI  Also taking Losartan 100 mg with HCTZ and Amlodipine  Current Meds  Medication Sig   amLODipine (NORVASC) 5 MG tablet Take 1 tablet (5 mg total) by mouth daily.   atorvastatin (LIPITOR) 40 MG tablet Take 1 tablet (40 mg total) by mouth daily with supper.   hydrochlorothiazide (HYDRODIURIL) 25 MG tablet Take 1 tablet by mouth once daily   losartan (COZAAR) 50 MG tablet TAKE 2 TABLETS BY MOUTH ONCE DAILY   metFORMIN (GLUCOPHAGE) 1000 MG tablet Take 1 tablet (1,000 mg total) by mouth 2 (two) times daily with a meal.   Allergies  Allergen Reactions   Lisinopril Swelling     Review of Systems    Objective:   BP 126/80 (BP Location: Left Arm, Patient Position: Sitting, Cuff Size: Large)   Pulse 76   Physical Exam   Assessment & Plan   BP excellent--CPM and see in November.

## 2021-01-19 NOTE — Progress Notes (Signed)
Pt. Is taking medications daily - Amlodipine 5 mg tablet and hydrochlorothiazide 25mg  tablet and has not missed any doses   Pt. Has been checking blood pressure at home with a digital monitor. Readings are below for Dr. review  Date         Reading *01/06/21      124/85 *01/07/21       137/88 *01/08/21        134/90 *01/09/21        134/88 *01/15/21           133/89 *01/16/21           132/87 *01/18/21            129/87 *01/19/21            129/81  Pt. Advised to continue taking medications as prescribed. readings will be discussed with Dr. 03/21/21 and someone will reach out to him if any recommendations

## 2021-01-21 NOTE — Progress Notes (Signed)
Patient notified of bp being fine on the 3 meds he is taking. Pat. Aware of new medication -Glipizide to take with his Metformin and meals in the morning and evening.  A1C and FLP scheduled for 04/30/21 at 9 am Pt informed of cholesterol results and recommendations to work on diet and physical activity as well

## 2021-04-30 ENCOUNTER — Other Ambulatory Visit: Payer: Self-pay

## 2021-05-10 ENCOUNTER — Other Ambulatory Visit: Payer: Self-pay

## 2021-05-10 ENCOUNTER — Encounter: Payer: Self-pay | Admitting: Internal Medicine

## 2021-05-10 ENCOUNTER — Ambulatory Visit: Payer: Self-pay | Admitting: Internal Medicine

## 2021-05-10 VITALS — BP 124/84 | HR 72 | Resp 16 | Ht 71.5 in | Wt 231.0 lb

## 2021-05-10 DIAGNOSIS — Z79899 Other long term (current) drug therapy: Secondary | ICD-10-CM

## 2021-05-10 DIAGNOSIS — E782 Mixed hyperlipidemia: Secondary | ICD-10-CM

## 2021-05-10 DIAGNOSIS — I10 Essential (primary) hypertension: Secondary | ICD-10-CM

## 2021-05-10 DIAGNOSIS — B359 Dermatophytosis, unspecified: Secondary | ICD-10-CM

## 2021-05-10 DIAGNOSIS — E1142 Type 2 diabetes mellitus with diabetic polyneuropathy: Secondary | ICD-10-CM

## 2021-05-10 NOTE — Patient Instructions (Signed)
Lotrimin (clotimazole) or Lamisil (Terbinafine) to area of foot twice daily for at least 2 weeks or more until resolved. May also apply hydrocortisone 1% twice daily to same area. Tea tree oil to entire foot daily is good as well.

## 2021-05-10 NOTE — Progress Notes (Signed)
    Subjective:    Patient ID: Adam Moyer, male   DOB: Feb 27, 1976, 45 y.o.   MRN: [id number]   HPI   Hypertension:  Taking meds regularly, though came fasting today and did not take meds.  When checks BP at home 120/80.  Continues on 3 drug regimen.     2.  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  States checks sugars rarely--155 range is typical in afternoon after evening meal.  Last A1C was 8.2%.   Last eye check was a year ago at [facility] when scratched cornea.    3.  Elevated LDL:  Remained at 84 in July.  Was working on lifestyle changes until last month.  On Atorvastatin 40 mg  4.  HM:  refuses influenza, COVID, and pneumococcal vaccines.    5.  Right foot with darkened area at base of 4th and 5th digits.  Has treated with a sugar scrub and Tea tree oil.  No itching.  Has had for 1 month  Current Meds  Medication Sig   amLODipine (NORVASC) 5 MG tablet Take 1 tablet (5 mg total) by mouth daily.   atorvastatin (LIPITOR) 40 MG tablet Take 1 tablet (40 mg total) by mouth daily with supper.   glipiZIDE (GLUCOTROL) 5 MG tablet 1 tab by mouth twice daily with meals   hydrochlorothiazide (HYDRODIURIL) 25 MG tablet Take 1 tablet by mouth once daily   losartan (COZAAR) 50 MG tablet TAKE 2 TABLETS BY MOUTH ONCE DAILY   metFORMIN (GLUCOPHAGE) 1000 MG tablet Take 1 tablet (1,000 mg total) by mouth 2 (two) times daily with a meal.   Allergies  Allergen Reactions   Lisinopril Swelling     Review of Systems    Objective:   BP 124/84 (BP Location: Right Arm, Patient Position: Sitting, Cuff Size: Normal)   Pulse 72   Resp 16   Ht 5' 11.5" (1.816 m)   Wt 231 lb (104.8 kg)   BMI 31.77 kg/m   Physical Exam NAD HEENT:  PERRL, EOMI Neck:  Supple, No adenopathy Chest:  CTA CV:  RRR with normal S1 andS2, No S3, S4 or murmur.  Radial and DP pulses normal and equal. Abd:   S, NT, No HSM or mass, + BS LE:  no edema.  Rough hyperpigmented dime sized lesion at base of 4th and 5th digits of right foot.  No central clearing.   Assessment & Plan   DM:  not clear if improved control.  A1C Encouraged having plan B if cannot get to gym with physical activity. Referral for eye exam --to take to optometry of choice.  2.  Hypertension:  controlled  3.  Hyperlipidemia:  continue Atorvastatin.  May need to increase dose to get LDL to goal.  CMP, FLP  4.  Tinea of foot:  topicals in patient instructions.  5.  HM:  refusing most immunizations.

## 2021-05-11 LAB — COMPREHENSIVE METABOLIC PANEL
ALT: 28 IU/L (ref 0–44)
AST: 22 IU/L (ref 0–40)
Albumin/Globulin Ratio: 2.2 (ref 1.2–2.2)
Albumin: 4.3 g/dL (ref 4.0–5.0)
Alkaline Phosphatase: 67 IU/L (ref 44–121)
BUN/Creatinine Ratio: 8 — ABNORMAL LOW (ref 9–20)
BUN: 10 mg/dL (ref 6–24)
Bilirubin Total: 0.2 mg/dL (ref 0.0–1.2)
CO2: 26 mmol/L (ref 20–29)
Calcium: 9.3 mg/dL (ref 8.7–10.2)
Chloride: 104 mmol/L (ref 96–106)
Creatinine, Ser: 1.2 mg/dL (ref 0.76–1.27)
Globulin, Total: 2 g/dL (ref 1.5–4.5)
Glucose: 102 mg/dL — ABNORMAL HIGH (ref 70–99)
Potassium: 4.3 mmol/L (ref 3.5–5.2)
Sodium: 142 mmol/L (ref 134–144)
Total Protein: 6.3 g/dL (ref 6.0–8.5)
eGFR: 76 mL/min/{1.73_m2} (ref >59)

## 2021-05-11 LAB — LIPID PANEL W/O CHOL/HDL RATIO
Cholesterol, Total: 149 mg/dL (ref 100–199)
HDL: 54 mg/dL (ref >39)
LDL Chol Calc (NIH): 80 mg/dL (ref 0–99)
Triglycerides: 77 mg/dL (ref 0–149)
VLDL Cholesterol Cal: 15 mg/dL (ref 5–40)

## 2021-05-11 LAB — HGB A1C W/O EAG: Hgb A1c MFr Bld: 6.8 % — ABNORMAL HIGH (ref 4.8–5.6)

## 2021-11-05 ENCOUNTER — Other Ambulatory Visit: Payer: Self-pay

## 2021-11-10 ENCOUNTER — Encounter: Payer: Self-pay | Admitting: Internal Medicine

## 2021-11-22 ENCOUNTER — Other Ambulatory Visit: Payer: Self-pay | Admitting: Internal Medicine

## 2021-12-07 ENCOUNTER — Other Ambulatory Visit: Payer: Self-pay | Admitting: Internal Medicine

## 2021-12-13 ENCOUNTER — Other Ambulatory Visit: Payer: Self-pay

## 2021-12-17 ENCOUNTER — Encounter: Payer: Self-pay | Admitting: Internal Medicine

## 2022-01-13 ENCOUNTER — Other Ambulatory Visit: Payer: Self-pay

## 2022-01-13 MED ORDER — GLIPIZIDE 5 MG PO TABS: ORAL_TABLET | ORAL | 2 refills | Status: DC

## 2022-01-13 MED ORDER — ATORVASTATIN CALCIUM 40 MG PO TABS: 40.0000 mg | ORAL_TABLET | Freq: Every day | ORAL | 0 refills | Status: DC

## 2022-01-13 MED ORDER — LOSARTAN POTASSIUM 50 MG PO TABS: ORAL_TABLET | ORAL | 0 refills | Status: DC

## 2022-01-19 ENCOUNTER — Other Ambulatory Visit: Payer: Self-pay | Admitting: Internal Medicine

## 2022-01-25 ENCOUNTER — Other Ambulatory Visit: Payer: Self-pay | Admitting: Internal Medicine

## 2022-05-07 ENCOUNTER — Other Ambulatory Visit: Payer: Self-pay | Admitting: Internal Medicine

## 2022-05-14 ENCOUNTER — Other Ambulatory Visit: Payer: Self-pay | Admitting: Internal Medicine

## 2022-05-19 ENCOUNTER — Other Ambulatory Visit: Payer: Self-pay

## 2022-05-19 MED ORDER — GLIPIZIDE 5 MG PO TABS: ORAL_TABLET | ORAL | 1 refills | Status: DC

## 2022-05-19 MED ORDER — METFORMIN HCL 1000 MG PO TABS: 1000.0000 mg | ORAL_TABLET | Freq: Two times a day (BID) | ORAL | 0 refills | Status: DC

## 2022-05-19 MED ORDER — HYDROCHLOROTHIAZIDE 25 MG PO TABS: 25.0000 mg | ORAL_TABLET | Freq: Every day | ORAL | 0 refills | Status: DC

## 2022-05-19 MED ORDER — ATORVASTATIN CALCIUM 40 MG PO TABS: 40.0000 mg | ORAL_TABLET | Freq: Every day | ORAL | 0 refills | Status: DC

## 2022-05-19 MED ORDER — AMLODIPINE BESYLATE 5 MG PO TABS: 5.0000 mg | ORAL_TABLET | Freq: Every day | ORAL | 1 refills | Status: DC

## 2022-05-19 MED ORDER — LOSARTAN POTASSIUM 50 MG PO TABS: 100.0000 mg | ORAL_TABLET | Freq: Every day | ORAL | 0 refills | Status: DC

## 2022-06-12 ENCOUNTER — Other Ambulatory Visit: Payer: Self-pay | Admitting: Internal Medicine

## 2022-07-20 ENCOUNTER — Ambulatory Visit: Payer: Self-pay | Admitting: Internal Medicine

## 2022-08-11 ENCOUNTER — Other Ambulatory Visit: Payer: Self-pay | Admitting: Internal Medicine

## 2022-08-15 ENCOUNTER — Other Ambulatory Visit: Payer: Self-pay | Admitting: Internal Medicine

## 2022-08-22 ENCOUNTER — Other Ambulatory Visit: Payer: Self-pay | Admitting: Internal Medicine

## 2022-08-24 ENCOUNTER — Other Ambulatory Visit: Payer: Self-pay

## 2022-08-24 MED ORDER — AMLODIPINE BESYLATE 5 MG PO TABS: 5.0000 mg | ORAL_TABLET | Freq: Every day | ORAL | 2 refills | Status: DC

## 2022-08-24 MED ORDER — LOSARTAN POTASSIUM 50 MG PO TABS: 100.0000 mg | ORAL_TABLET | Freq: Every day | ORAL | 0 refills | Status: DC

## 2022-08-24 MED ORDER — METFORMIN HCL 1000 MG PO TABS: 1000.0000 mg | ORAL_TABLET | Freq: Two times a day (BID) | ORAL | 0 refills | Status: DC

## 2022-10-27 ENCOUNTER — Other Ambulatory Visit: Payer: Self-pay | Admitting: Internal Medicine

## 2022-11-03 ENCOUNTER — Other Ambulatory Visit: Payer: Self-pay

## 2022-11-03 MED ORDER — LOSARTAN POTASSIUM 50 MG PO TABS: 100.0000 mg | ORAL_TABLET | Freq: Every day | ORAL | 0 refills | Status: DC

## 2022-11-03 MED ORDER — AMLODIPINE BESYLATE 5 MG PO TABS: 5.0000 mg | ORAL_TABLET | Freq: Every day | ORAL | 2 refills | Status: DC

## 2022-11-03 MED ORDER — METFORMIN HCL 1000 MG PO TABS: 1000.0000 mg | ORAL_TABLET | Freq: Two times a day (BID) | ORAL | 0 refills | Status: DC

## 2022-11-03 MED ORDER — GLIPIZIDE 5 MG PO TABS: 5.0000 mg | ORAL_TABLET | Freq: Two times a day (BID) | ORAL | 2 refills | Status: DC

## 2022-11-03 MED ORDER — ATORVASTATIN CALCIUM 40 MG PO TABS: 40.0000 mg | ORAL_TABLET | Freq: Every day | ORAL | 0 refills | Status: DC

## 2022-11-16 ENCOUNTER — Ambulatory Visit: Payer: Self-pay | Admitting: Internal Medicine

## 2022-11-18 ENCOUNTER — Other Ambulatory Visit: Payer: Self-pay | Admitting: Internal Medicine

## 2023-01-10 ENCOUNTER — Other Ambulatory Visit: Payer: Self-pay

## 2023-01-10 MED ORDER — METFORMIN HCL 1000 MG PO TABS: 1000.0000 mg | ORAL_TABLET | Freq: Two times a day (BID) | ORAL | 0 refills | Status: DC

## 2023-02-06 ENCOUNTER — Ambulatory Visit: Payer: Self-pay | Admitting: Internal Medicine

## 2023-02-06 ENCOUNTER — Telehealth: Payer: Self-pay

## 2023-02-06 NOTE — Telephone Encounter (Signed)
Patient would like to be on the waiting list for a sooner appointment than his rescheduled appointment in 05/30/2023. Patient's wife stated patient is available any day at anytime.  Will call patient if there is any cancellation.

## 2023-02-08 NOTE — Telephone Encounter (Signed)
Patient has been scheduled

## 2023-02-10 ENCOUNTER — Encounter: Payer: Self-pay | Admitting: Internal Medicine

## 2023-02-10 ENCOUNTER — Ambulatory Visit (INDEPENDENT_AMBULATORY_CARE_PROVIDER_SITE_OTHER): Payer: Self-pay | Admitting: Internal Medicine

## 2023-02-10 VITALS — BP 120/76 | HR 87 | Resp 16 | Ht 71.5 in | Wt 234.0 lb

## 2023-02-10 DIAGNOSIS — E1142 Type 2 diabetes mellitus with diabetic polyneuropathy: Secondary | ICD-10-CM

## 2023-02-10 DIAGNOSIS — Z114 Encounter for screening for human immunodeficiency virus [HIV]: Secondary | ICD-10-CM

## 2023-02-10 DIAGNOSIS — Z125 Encounter for screening for malignant neoplasm of prostate: Secondary | ICD-10-CM

## 2023-02-10 DIAGNOSIS — E782 Mixed hyperlipidemia: Secondary | ICD-10-CM

## 2023-02-10 DIAGNOSIS — I10 Essential (primary) hypertension: Secondary | ICD-10-CM

## 2023-02-10 MED ORDER — METFORMIN HCL 1000 MG PO TABS: 1000.0000 mg | ORAL_TABLET | Freq: Two times a day (BID) | ORAL | 3 refills | Status: DC

## 2023-02-10 MED ORDER — GLIPIZIDE 5 MG PO TABS: 5.0000 mg | ORAL_TABLET | Freq: Two times a day (BID) | ORAL | 3 refills | Status: DC

## 2023-02-10 MED ORDER — ATORVASTATIN CALCIUM 40 MG PO TABS: 40.0000 mg | ORAL_TABLET | Freq: Every day | ORAL | 3 refills | Status: DC

## 2023-02-10 MED ORDER — LOSARTAN POTASSIUM 50 MG PO TABS: 100.0000 mg | ORAL_TABLET | Freq: Every day | ORAL | 3 refills | Status: DC

## 2023-02-10 MED ORDER — AMLODIPINE BESYLATE 5 MG PO TABS: 5.0000 mg | ORAL_TABLET | Freq: Every day | ORAL | 3 refills | Status: DC

## 2023-02-10 MED ORDER — HYDROCHLOROTHIAZIDE 25 MG PO TABS: 25.0000 mg | ORAL_TABLET | Freq: Every day | ORAL | 3 refills | Status: DC

## 2023-02-10 NOTE — Progress Notes (Signed)
    Subjective:    Patient ID: Adam Moyer, male   DOB: January 12, 1976, 47 y.o.   MRN: [id number]   HPI  Here after a long hiatus.  Not seen since 04/2021 No insurance currently   DM:  Will have eye check once insurance kicks in Sugars 130-170.     2.  Hyperlipidemia:  states has been taking ATorvastatin.  3.  Hypertension:  Despite not prescribing, he stretched out meds and bp fine  Current Meds  Medication Sig   amLODipine (NORVASC) 5 MG tablet Take 1 tablet (5 mg total) by mouth daily.   atorvastatin (LIPITOR) 40 MG tablet Take 1 tablet (40 mg total) by mouth daily with supper.   glipiZIDE (GLUCOTROL) 5 MG tablet Take 1 tablet (5 mg total) by mouth 2 (two) times daily with a meal.   hydrochlorothiazide (HYDRODIURIL) 25 MG tablet Take 1 tablet by mouth once daily   losartan (COZAAR) 50 MG tablet Take 2 tablets (100 mg total) by mouth daily.   metFORMIN (GLUCOPHAGE) 1000 MG tablet Take 1 tablet (1,000 mg total) by mouth 2 (two) times daily with a meal.   Allergies  Allergen Reactions   Lisinopril Swelling     Review of Systems    Objective:   BP 120/76 (BP Location: Left Arm, Patient Position: Sitting, Cuff Size: Normal)   Pulse 87   Resp 16   Ht 5' 11.5" (1.816 m)   Wt 234 lb (106.1 kg)   BMI 32.18 kg/m   Physical Exam HENT:     Head: Normocephalic and atraumatic.  Eyes:     Extraocular Movements: Extraocular movements intact.     Pupils: Pupils are equal, round, and reactive to light.  Cardiovascular:     Rate and Rhythm: Normal rate and regular rhythm.     Heart sounds: S1 normal and S2 normal. No murmur heard.    No friction rub. No S3 or S4 sounds.     Comments: No carotid bruits.  Carotid, radial, femoral, DP and PT pulses normal and equal.   Pulmonary:     Effort: Pulmonary effort is normal.     Breath sounds: Normal breath sounds and air entry.  Abdominal:     General: Bowel sounds are normal.     Palpations: Abdomen is soft. There is no  hepatomegaly, splenomegaly or mass.     Tenderness: There is no abdominal tenderness.     Hernia: No hernia is present.  Musculoskeletal:     Right lower leg: No edema.     Left lower leg: No edema.  Neurological:     Mental Status: He is alert.      Assessment & Plan   DM:  A1C, urine microalbumin/crea.  Refill metformin/glipizide.  Consider addition of Jardiance and wean of glipizide in future if covered by new insurance. Referral to ophthalmology once he has insurance for diabetic eye exam.  2.  Hyperlipidemia:  Lipids, nonfasting today.  CMP.  Refill Atorvastatin.  3.  Hypertension:  surprisingly controlled as has been stretching meds due to poor follow up.    4.  HM:  Refusing all vaccines.  Encouraged influenza, COVID boosters, Pneumococcal.   PSA today.

## 2023-02-11 LAB — HIV ANTIBODY (ROUTINE TESTING W REFLEX): HIV Screen 4th Generation wRfx: NONREACTIVE

## 2023-02-11 LAB — COMPREHENSIVE METABOLIC PANEL
ALT: 28 IU/L (ref 0–44)
AST: 22 IU/L (ref 0–40)
Albumin: 4.7 g/dL (ref 4.1–5.1)
Alkaline Phosphatase: 73 IU/L (ref 44–121)
BUN/Creatinine Ratio: 10 (ref 9–20)
BUN: 11 mg/dL (ref 6–24)
Bilirubin Total: 0.3 mg/dL (ref 0.0–1.2)
CO2: 24 mmol/L (ref 20–29)
Calcium: 10 mg/dL (ref 8.7–10.2)
Chloride: 100 mmol/L (ref 96–106)
Creatinine, Ser: 1.09 mg/dL (ref 0.76–1.27)
Globulin, Total: 2.8 g/dL (ref 1.5–4.5)
Glucose: 114 mg/dL — ABNORMAL HIGH (ref 70–99)
Potassium: 4.1 mmol/L (ref 3.5–5.2)
Sodium: 138 mmol/L (ref 134–144)
Total Protein: 7.5 g/dL (ref 6.0–8.5)
eGFR: 85 mL/min/{1.73_m2} (ref >59)

## 2023-02-11 LAB — CBC WITH DIFFERENTIAL/PLATELET
Basophils Absolute: 0 10*3/uL (ref 0.0–0.2)
Basos: 0 %
EOS (ABSOLUTE): 0.2 10*3/uL (ref 0.0–0.4)
Eos: 2 %
Hematocrit: 42.1 % (ref 37.5–51.0)
Hemoglobin: 13.6 g/dL (ref 13.0–17.7)
Immature Grans (Abs): 0 10*3/uL (ref 0.0–0.1)
Immature Granulocytes: 0 %
Lymphocytes Absolute: 2.6 10*3/uL (ref 0.7–3.1)
Lymphs: 25 %
MCH: 26.8 pg (ref 26.6–33.0)
MCHC: 32.3 g/dL (ref 31.5–35.7)
MCV: 83 fL (ref 79–97)
Monocytes Absolute: 0.5 10*3/uL (ref 0.1–0.9)
Monocytes: 5 %
Neutrophils Absolute: 7.1 10*3/uL — ABNORMAL HIGH (ref 1.4–7.0)
Neutrophils: 68 %
Platelets: 310 10*3/uL (ref 150–450)
RBC: 5.08 x10E6/uL (ref 4.14–5.80)
RDW: 14.2 % (ref 11.6–15.4)
WBC: 10.4 10*3/uL (ref 3.4–10.8)

## 2023-02-11 LAB — LIPID PANEL W/O CHOL/HDL RATIO
Cholesterol, Total: 174 mg/dL (ref 100–199)
HDL: 53 mg/dL (ref >39)
LDL Chol Calc (NIH): 101 mg/dL — ABNORMAL HIGH (ref 0–99)
Triglycerides: 109 mg/dL (ref 0–149)
VLDL Cholesterol Cal: 20 mg/dL (ref 5–40)

## 2023-02-11 LAB — HGB A1C W/O EAG: Hgb A1c MFr Bld: 7.2 % — ABNORMAL HIGH (ref 4.8–5.6)

## 2023-02-11 LAB — PSA: Prostate Specific Ag, Serum: 0.8 ng/mL (ref 0.0–4.0)

## 2023-02-11 LAB — MICROALBUMIN / CREATININE URINE RATIO

## 2023-05-30 ENCOUNTER — Ambulatory Visit: Payer: Self-pay | Admitting: Internal Medicine

## 2023-06-16 ENCOUNTER — Other Ambulatory Visit: Payer: Self-pay

## 2023-06-16 DIAGNOSIS — E1142 Type 2 diabetes mellitus with diabetic polyneuropathy: Secondary | ICD-10-CM

## 2023-06-16 DIAGNOSIS — E782 Mixed hyperlipidemia: Secondary | ICD-10-CM

## 2023-06-17 LAB — HEMOGLOBIN A1C
Est. average glucose Bld gHb Est-mCnc: 171 mg/dL
Hgb A1c MFr Bld: 7.6 % — ABNORMAL HIGH (ref 4.8–5.6)

## 2023-06-17 LAB — LIPID PANEL W/O CHOL/HDL RATIO
Cholesterol, Total: 174 mg/dL (ref 100–199)
HDL: 44 mg/dL (ref >39)
LDL Chol Calc (NIH): 100 mg/dL — ABNORMAL HIGH (ref 0–99)
Triglycerides: 171 mg/dL — ABNORMAL HIGH (ref 0–149)
VLDL Cholesterol Cal: 30 mg/dL (ref 5–40)

## 2023-06-20 ENCOUNTER — Ambulatory Visit: Payer: Self-pay | Admitting: Internal Medicine

## 2023-06-20 ENCOUNTER — Encounter: Payer: Self-pay | Admitting: Internal Medicine

## 2023-06-20 VITALS — BP 138/84 | HR 96 | Resp 16 | Ht 71.5 in | Wt 241.0 lb

## 2023-06-20 DIAGNOSIS — E1142 Type 2 diabetes mellitus with diabetic polyneuropathy: Secondary | ICD-10-CM

## 2023-06-20 DIAGNOSIS — E782 Mixed hyperlipidemia: Secondary | ICD-10-CM

## 2023-06-20 DIAGNOSIS — I10 Essential (primary) hypertension: Secondary | ICD-10-CM

## 2023-06-20 MED ORDER — EMPAGLIFLOZIN 10 MG PO TABS: 10.0000 mg | ORAL_TABLET | Freq: Every day | ORAL | 11 refills | Status: DC

## 2023-06-20 MED ORDER — ATORVASTATIN CALCIUM 80 MG PO TABS: ORAL_TABLET | ORAL | 11 refills | Status: DC

## 2023-06-20 NOTE — Progress Notes (Signed)
 Subjective:    Patient ID: Adam Moyer, male   DOB: 06-04-1976, 48 y.o.   MRN: [id number]   HPI   DM:  A1C back up to 7.6%  He is willing to consider Jardiance  and perhaps GLP1 agonist down the road.  He has stopped his 6 pack of [place] Ultra yesterday.    2.  LDL still at 100 and HDL down to 44.  Trigs up as well.   Lipid Panel     Component Value Date/Time   CHOL 174 06/16/2023 0850   TRIG 171 (H) 06/16/2023 0850   HDL 44 06/16/2023 0850   LDLCALC 100 (H) 06/16/2023 0850   LABVLDL 30 06/16/2023 0850     Current Meds  Medication Sig   amLODipine  (NORVASC ) 5 MG tablet Take 1 tablet (5 mg total) by mouth daily.   atorvastatin  (LIPITOR) 40 MG tablet Take 1 tablet (40 mg total) by mouth daily with supper.   glipiZIDE  (GLUCOTROL ) 5 MG tablet Take 1 tablet (5 mg total) by mouth 2 (two) times daily with a meal.   hydrochlorothiazide  (HYDRODIURIL ) 25 MG tablet Take 1 tablet (25 mg total) by mouth daily.   losartan  (COZAAR ) 50 MG tablet Take 2 tablets (100 mg total) by mouth daily.   metFORMIN  (GLUCOPHAGE ) 1000 MG tablet Take 1 tablet (1,000 mg total) by mouth 2 (two) times daily with a meal.   Allergies  Allergen Reactions   Lisinopril  Swelling     Review of Systems    Objective:   BP 138/84 (BP Location: Right Arm, Patient Position: Sitting, Cuff Size: Normal)   Pulse 96   Resp 16   Ht 5' 11.5 (1.816 m)   Wt 241 lb (109.3 kg)   BMI 33.14 kg/m   Physical Exam Neck:     Thyroid: No thyroid mass or thyromegaly.  Cardiovascular:     Pulses: Normal pulses.          Dorsalis pedis pulses are 2+ on the right side and 2+ on the left side.       Posterior tibial pulses are 2+ on the right side and 2+ on the left side.     Heart sounds: S1 normal and S2 normal. No murmur heard.    No friction rub. No S3 or S4 sounds.  Pulmonary:     Breath sounds: Normal breath sounds and air entry.  Musculoskeletal:     Cervical back: Normal range of motion and neck supple.      Right lower leg: No edema.     Left lower leg: No edema.  Feet:     Right foot:     Protective Sensation: 10 sites tested.  10 sites sensed.     Skin integrity: Skin integrity normal.     Left foot:     Protective Sensation: 10 sites tested.  10 sites sensed.     Skin integrity: Skin integrity normal.  Lymphadenopathy:     Cervical: No cervical adenopathy.      Assessment & Plan   DM:  Add Jardiance  to Metformin  and Glipizide .  A1C, CMP, urine microalbumin/crea in 3 months.  Continue to work on lifestyle changes.  Consider Ozempic if not at goal in 3 months.  2.  Hyperlipidemia:  Not at goal with HDL/LDL.  Increase Atorvastatin  to 80 mg daily.  FLP/CmP in 3 months.    3.  Hypertension:  fair control.  CPM.  4.  HM:  encouraged obtaining COVID and influenza vaccines.  He  will think about it.

## 2023-09-28 ENCOUNTER — Other Ambulatory Visit: Payer: Self-pay

## 2023-10-03 ENCOUNTER — Ambulatory Visit: Payer: Self-pay | Admitting: Internal Medicine

## 2023-10-11 ENCOUNTER — Other Ambulatory Visit: Payer: Self-pay

## 2023-10-11 MED ORDER — HYDROCHLOROTHIAZIDE 25 MG PO TABS: 25.0000 mg | ORAL_TABLET | Freq: Every day | ORAL | 3 refills | Status: DC

## 2023-10-13 ENCOUNTER — Other Ambulatory Visit: Payer: Self-pay

## 2023-10-18 ENCOUNTER — Ambulatory Visit: Payer: Self-pay | Admitting: Internal Medicine

## 2024-01-11 ENCOUNTER — Other Ambulatory Visit: Payer: Self-pay | Admitting: Internal Medicine

## 2024-01-26 ENCOUNTER — Other Ambulatory Visit: Payer: Self-pay

## 2024-01-31 ENCOUNTER — Ambulatory Visit: Payer: Self-pay | Admitting: Internal Medicine

## 2024-02-15 ENCOUNTER — Other Ambulatory Visit: Payer: Self-pay | Admitting: Internal Medicine

## 2024-02-16 ENCOUNTER — Encounter (HOSPITAL_COMMUNITY): Payer: Self-pay

## 2024-02-16 ENCOUNTER — Ambulatory Visit (HOSPITAL_COMMUNITY)
Admission: EM | Admit: 2024-02-16 | Discharge: 2024-02-16 | Disposition: A | Discharge status: Home / Self Care | Payer: Self-pay | Attending: Emergency Medicine | Admitting: Emergency Medicine

## 2024-02-16 DIAGNOSIS — K529 Noninfective gastroenteritis and colitis, unspecified: Secondary | ICD-10-CM

## 2024-02-16 DIAGNOSIS — R1110 Vomiting, unspecified: Secondary | ICD-10-CM

## 2024-02-16 DIAGNOSIS — R739 Hyperglycemia, unspecified: Secondary | ICD-10-CM

## 2024-02-16 LAB — POCT URINALYSIS DIP (MANUAL ENTRY)
Bilirubin, UA: NEGATIVE
Blood, UA: NEGATIVE
Glucose, UA: NEGATIVE mg/dL
Ketones, POC UA: NEGATIVE mg/dL
Leukocytes, UA: NEGATIVE
Nitrite, UA: NEGATIVE
Protein Ur, POC: NEGATIVE mg/dL
Spec Grav, UA: 1.01 (ref 1.010–1.025)
Urobilinogen, UA: 0.2 U/dL
pH, UA: 5.5 (ref 5.0–8.0)

## 2024-02-16 LAB — POC COVID19/FLU A&B COMBO
Covid Antigen, POC: NEGATIVE
Influenza A Antigen, POC: NEGATIVE
Influenza B Antigen, POC: NEGATIVE

## 2024-02-16 LAB — POCT FASTING CBG KUC MANUAL ENTRY: POCT Glucose (KUC): 227 mg/dL — AB (ref 70–99)

## 2024-02-16 MED ORDER — ONDANSETRON 4 MG PO TBDP: 4.0000 mg | ORAL_TABLET | Freq: Four times a day (QID) | ORAL | 0 refills | Status: DC | PRN

## 2024-02-16 MED ORDER — KETOROLAC TROMETHAMINE 30 MG/ML IJ SOLN
INTRAMUSCULAR | Status: AC
  Filled 2024-02-16: qty 1

## 2024-02-16 MED ORDER — ONDANSETRON 4 MG PO TBDP
ORAL_TABLET | ORAL | Status: AC
  Filled 2024-02-16: qty 1

## 2024-02-16 MED ORDER — ONDANSETRON 4 MG PO TBDP
4.0000 mg | ORAL_TABLET | Freq: Once | ORAL | Status: AC
  Administered 2024-02-16: 4 mg via ORAL

## 2024-02-16 MED ORDER — CYCLOBENZAPRINE HCL 10 MG PO TABS: 10.0000 mg | ORAL_TABLET | Freq: Every evening | ORAL | 0 refills | Status: DC

## 2024-02-16 MED ORDER — KETOROLAC TROMETHAMINE 30 MG/ML IJ SOLN
30.0000 mg | Freq: Once | INTRAMUSCULAR | Status: AC
  Administered 2024-02-16: 30 mg via INTRAMUSCULAR

## 2024-02-16 NOTE — ED Provider Notes (Signed)
 [facility]    CSN: [id number] Arrival date & time: 02/16/24  9063      History   Chief Complaint Chief Complaint  Patient presents with   Emesis    HPI Adam Moyer is a 48 y.o. male.  Here with 1 day history of several symptoms Started with headache, bodyaches, fatigue. Then had several episodes of emesis overnight and today. Dark brown liquid stool. No blood. Having abdominal pain and nausea still Generalized body aches rated 8/10 Has tolerated fluids today.   No fevers known but felt sweaty last night  Not having sore throat, congestion, cough, rash. Took advil yesterday. No meds yet today  Not sure of any sick contacts No recent travel   DM history. Last A1c 7.6 Taking meds as directed   Past Medical History:  Diagnosis Date   Diabetes mellitus 2010   Hyperlipidemia 2017   Hypertension 2010    Patient Active Problem List   Diagnosis Date Noted   Tinea 05/10/2021   DM type 2 with diabetic peripheral neuropathy (HCC) 06/13/2008   Primary hypertension 06/13/2008   Hyperlipidemia 06/13/2008    History reviewed. No pertinent surgical history.     Home Medications    Prior to Admission medications   Medication Sig Start Date End Date Taking? Authorizing Provider  cyclobenzaprine  (FLEXERIL ) 10 MG tablet Take 1 tablet (10 mg total) by mouth at bedtime. If needed for body aches 02/16/24  Yes Reginaldo Hazard, Asberry, PA-C  ondansetron  (ZOFRAN -ODT) 4 MG disintegrating tablet Take 1 tablet (4 mg total) by mouth every 6 (six) hours as needed for nausea or vomiting. 02/16/24  Yes Daily Crate, Asberry, PA-C  amLODipine  (NORVASC ) 5 MG tablet Take 1 tablet by mouth once daily 01/12/24   Adella Norris, MD  atorvastatin  (LIPITOR) 80 MG tablet 1 tab by mouth daily with evening meal 06/20/23   Adella Norris, MD  empagliflozin  (JARDIANCE ) 10 MG TABS tablet Take 1 tablet (10 mg total) by mouth daily before breakfast. 06/20/23   Adella Norris, MD  glipiZIDE   (GLUCOTROL ) 5 MG tablet Take 1 tablet (5 mg total) by mouth 2 (two) times daily with a meal. 02/10/23   Adella Norris, MD  hydrochlorothiazide  (HYDRODIURIL ) 25 MG tablet Take 1 tablet (25 mg total) by mouth daily. 10/11/23   Adella Norris, MD  losartan  (COZAAR ) 50 MG tablet Take 2 tablets (100 mg total) by mouth daily. 02/10/23   Adella Norris, MD  metFORMIN  (GLUCOPHAGE ) 1000 MG tablet Take 1 tablet (1,000 mg total) by mouth 2 (two) times daily with a meal. 02/10/23   Adella Norris, MD    Family History Family History  Problem Relation Age of Onset   Diabetes Mother    Hypertension Mother    Hyperlipidemia Mother    Heart disease Father        MI   Diabetes Sister    Hyperlipidemia Sister    Hypertension Sister    Hypertension Brother    Hyperlipidemia Brother    Diabetes Brother     Social History Social History   Tobacco Use   Smoking status: Former    Current packs/day: 0.00    Average packs/day: 1 pack/day for 17.0 years (17.0 ttl pk-yrs)    Types: Cigarettes    Start date: 10/12/1998    Quit date: 10/12/2015    Years since quitting: 8.3   Smokeless tobacco: Never  Substance Use Topics   Alcohol use: Yes    Comment: occasional   Drug use: Yes  Types: Marijuana    Comment: every other day     Allergies   Lisinopril    Review of Systems Review of Systems As per HPI  Physical Exam Triage Vital Signs ED Triage Vitals [02/16/24 1034]  Encounter Vitals Group     BP (!) 152/82     Girls Systolic BP Percentile      Girls Diastolic BP Percentile      Boys Systolic BP Percentile      Boys Diastolic BP Percentile      Pulse Rate 99     Resp 18     Temp 97.9 F (36.6 C)     Temp Source Oral     SpO2 97 %     Weight      Height      Head Circumference      Peak Flow      Pain Score 8     Pain Loc      Pain Education      Exclude from Growth Chart    No data found.  Updated Vital Signs BP (!) 152/82 (BP Location: Left Arm)   Pulse  86   Temp 97.9 F (36.6 C) (Oral)   Resp 18   SpO2 97%   Physical Exam Vitals and nursing note reviewed.  Constitutional:      Appearance: He is not ill-appearing.  HENT:     Nose: No rhinorrhea.     Mouth/Throat:     Mouth: Mucous membranes are moist.     Pharynx: Oropharynx is clear. No posterior oropharyngeal erythema.     Comments: Moist membranes  Eyes:     Conjunctiva/sclera: Conjunctivae normal.  Cardiovascular:     Rate and Rhythm: Normal rate and regular rhythm.     Pulses: Normal pulses.     Heart sounds: Normal heart sounds.  Pulmonary:     Effort: Pulmonary effort is normal.     Breath sounds: Normal breath sounds.  Abdominal:     General: There is no distension.     Tenderness: There is generalized abdominal tenderness. There is no guarding or rebound.  Musculoskeletal:        General: Normal range of motion.     Cervical back: Normal range of motion.  Lymphadenopathy:     Cervical: No cervical adenopathy.  Skin:    General: Skin is warm and dry.  Neurological:     Mental Status: He is alert and oriented to person, place, and time.     Sensory: No sensory deficit.     Motor: No weakness.     Gait: Gait normal.     Comments: Strength and sensation intact, equal      UC Treatments / Results  Labs (all labs ordered are listed, but only abnormal results are displayed) Labs Reviewed  POCT FASTING CBG [facility] MANUAL ENTRY - Abnormal; Notable for the following components:      Result Value   POCT Glucose ([facility]) 227 (*)    All other components within normal limits  POCT URINALYSIS DIP (MANUAL ENTRY)  POC COVID19/FLU A&B COMBO    EKG  Radiology No results found.  Procedures Procedures   Medications Ordered in UC Medications  ondansetron  (ZOFRAN -ODT) disintegrating tablet 4 mg (4 mg Oral Given 02/16/24 1140)  ketorolac  (TORADOL ) 30 MG/ML injection 30 mg (30 mg Intramuscular Given 02/16/24 1141)    Initial Impression / Assessment and Plan / UC Course  I  have reviewed the triage vital signs and the  nursing notes.  Pertinent labs & imaging results that were available during my care of the patient were reviewed by me and considered in my medical decision making (see chart for details).  Afebrile. Well appearing, neurologically intact CBG 227 UA unremarkable, reassuring no glucose or ketones   Rapid covid and flu are negative   Zofran  ODT given for nausea, with IM toradol  for pain Patient reports improvement in symptoms. Nausea and body aches have improved   Reassuring he is improved and vitals are stable Suspect viral gastroenteritis Supportive care discussed Zofran  q6 hours prn, bland diet as tolerated, increased fluids, tylenol  prn Sent muscle relaxer to try at bedtime for body aches Return and ED precautions Note for work provided  Final Clinical Impressions(s) / UC Diagnoses   Final diagnoses:  Vomiting, unspecified vomiting type, unspecified whether nausea present  Gastroenteritis  Hyperglycemia     Discharge Instructions      The zofran  can be used every 6 hours as needed to settle the stomach  Drink LOTS of water and fluids to stay hydrated!!  A Toradol  injection was given today for pain. Please do not use any NSAIDs (ibuprofen/Advil, naproxen/Aleve, etc) for the next 24 hours. You can safely use tylenol  -- which will not upset the stomach  Bland diet as tolerated (crackers, toast, rice, banana, etc)  Continue taking your diabetes medicines as directed  Please go to the emergency department if symptoms worsen or become severe     ED Prescriptions     Medication Sig Dispense Auth. Provider   ondansetron  (ZOFRAN -ODT) 4 MG disintegrating tablet Take 1 tablet (4 mg total) by mouth every 6 (six) hours as needed for nausea or vomiting. 20 tablet Vincy Feliz, PA-C   cyclobenzaprine  (FLEXERIL ) 10 MG tablet Take 1 tablet (10 mg total) by mouth at bedtime. If needed for body aches 10 tablet Quanisha Drewry, Asberry, PA-C       PDMP not reviewed this encounter.   Jeryl Asberry, PA-C 02/16/24 1234

## 2024-02-16 NOTE — Discharge Instructions (Signed)
 The zofran  can be used every 6 hours as needed to settle the stomach  Drink LOTS of water and fluids to stay hydrated!!  A Toradol  injection was given today for pain. Please do not use any NSAIDs (ibuprofen/Advil, naproxen/Aleve, etc) for the next 24 hours. You can safely use tylenol  -- which will not upset the stomach  Bland diet as tolerated (crackers, toast, rice, banana, etc)  Continue taking your diabetes medicines as directed  Please go to the emergency department if symptoms worsen or become severe

## 2024-02-16 NOTE — ED Triage Notes (Signed)
 Pt c/o vomiting and diarrhea with black stools x2 days. States can keep fluids down barely. C/o weakness, body aches, and headaches. Took advil with no relief.

## 2024-03-11 ENCOUNTER — Emergency Department (HOSPITAL_COMMUNITY): Payer: Self-pay

## 2024-03-11 ENCOUNTER — Emergency Department (HOSPITAL_COMMUNITY)
Admission: EM | Admit: 2024-03-11 | Discharge: 2024-03-11 | Disposition: A | Discharge status: Home / Self Care | Payer: Self-pay | Attending: Emergency Medicine | Admitting: Emergency Medicine

## 2024-03-11 ENCOUNTER — Other Ambulatory Visit: Payer: Self-pay

## 2024-03-11 ENCOUNTER — Encounter (HOSPITAL_COMMUNITY): Payer: Self-pay

## 2024-03-11 DIAGNOSIS — I10 Essential (primary) hypertension: Secondary | ICD-10-CM | POA: Insufficient documentation

## 2024-03-11 DIAGNOSIS — K029 Dental caries, unspecified: Secondary | ICD-10-CM | POA: Insufficient documentation

## 2024-03-11 DIAGNOSIS — Z79899 Other long term (current) drug therapy: Secondary | ICD-10-CM | POA: Insufficient documentation

## 2024-03-11 DIAGNOSIS — Z7984 Long term (current) use of oral hypoglycemic drugs: Secondary | ICD-10-CM | POA: Insufficient documentation

## 2024-03-11 DIAGNOSIS — E119 Type 2 diabetes mellitus without complications: Secondary | ICD-10-CM | POA: Insufficient documentation

## 2024-03-11 LAB — COMPREHENSIVE METABOLIC PANEL WITH GFR
ALT: 31 U/L (ref 0–44)
AST: 18 U/L (ref 15–41)
Albumin: 3.8 g/dL (ref 3.5–5.0)
Alkaline Phosphatase: 61 U/L (ref 38–126)
Anion gap: 13 (ref 5–15)
BUN: 8 mg/dL (ref 6–20)
CO2: 23 mmol/L (ref 22–32)
Calcium: 9.3 mg/dL (ref 8.9–10.3)
Chloride: 98 mmol/L (ref 98–111)
Creatinine, Ser: 1.24 mg/dL (ref 0.61–1.24)
GFR, Estimated: >60 mL/min (ref >60)
Glucose, Bld: 245 mg/dL — ABNORMAL HIGH (ref 70–99)
Potassium: 3.5 mmol/L (ref 3.5–5.1)
Sodium: 134 mmol/L — ABNORMAL LOW (ref 135–145)
Total Bilirubin: 0.3 mg/dL (ref 0.0–1.2)
Total Protein: 6.7 g/dL (ref 6.5–8.1)

## 2024-03-11 LAB — CBC WITH DIFFERENTIAL/PLATELET
Abs Immature Granulocytes: 0.04 K/uL (ref 0.00–0.07)
Basophils Absolute: 0.1 K/uL (ref 0.0–0.1)
Basophils Relative: 0 %
Eosinophils Absolute: 0.2 K/uL (ref 0.0–0.5)
Eosinophils Relative: 2 %
HCT: 41 % (ref 39.0–52.0)
Hemoglobin: 13.2 g/dL (ref 13.0–17.0)
Immature Granulocytes: 0 %
Lymphocytes Relative: 16 %
Lymphs Abs: 1.9 K/uL (ref 0.7–4.0)
MCH: 26.8 pg (ref 26.0–34.0)
MCHC: 32.2 g/dL (ref 30.0–36.0)
MCV: 83.2 fL (ref 80.0–100.0)
Monocytes Absolute: 0.7 K/uL (ref 0.1–1.0)
Monocytes Relative: 5 %
Neutro Abs: 9.3 K/uL — ABNORMAL HIGH (ref 1.7–7.7)
Neutrophils Relative %: 77 %
Platelets: 372 K/uL (ref 150–400)
RBC: 4.93 MIL/uL (ref 4.22–5.81)
RDW: 15 % (ref 11.5–15.5)
WBC: 12.2 K/uL — ABNORMAL HIGH (ref 4.0–10.5)
nRBC: 0 % (ref 0.0–0.2)

## 2024-03-11 MED ORDER — AMOXICILLIN-POT CLAVULANATE 500-125 MG PO TABS: 1.0000 | ORAL_TABLET | Freq: Three times a day (TID) | ORAL | 0 refills | Status: DC

## 2024-03-11 MED ORDER — AMOXICILLIN-POT CLAVULANATE 875-125 MG PO TABS
1.0000 | ORAL_TABLET | Freq: Once | ORAL | Status: AC
  Administered 2024-03-11: 1 via ORAL
  Filled 2024-03-11: qty 1

## 2024-03-11 MED ORDER — KETOROLAC TROMETHAMINE 30 MG/ML IJ SOLN
15.0000 mg | Freq: Once | INTRAMUSCULAR | Status: AC
  Administered 2024-03-11: 15 mg via INTRAMUSCULAR
  Filled 2024-03-11: qty 1

## 2024-03-11 MED ORDER — LIDOCAINE HCL 2 % IJ SOLN
5.0000 mL | Freq: Once | INTRAMUSCULAR | Status: AC
  Administered 2024-03-11: 100 mg
  Filled 2024-03-11: qty 20

## 2024-03-11 NOTE — ED Provider Notes (Signed)
 [place] EM[facility]MENT AT [facility] Provider Note   CSN: [id number] Arrival date & time: 03/11/24  1316     Patient presents with: Allergic Reaction and Oral Swelling   Adam Moyer is a 48 y.o. male.  Patient is a 48 year old male with PMH of T2DM, HTN, HLD presenting to the ED via POV with a chief complaint of x 2 days of worsening right sided maxillary facial discomfort described as aching/burning, worsened with eating.  Patient denies any fevers, chills, nausea, vomiting, rashes.  He denies any known history of HSV.  Patient further denies any changes in vision, headaches, and syncope.  Prior to Admission medications   Medication Sig Start Date End Date Taking? Authorizing Provider  amLODipine  (NORVASC ) 5 MG tablet Take 1 tablet by mouth once daily 01/12/24   Adella Norris, MD  amoxicillin-clavulanate (AUGMENTIN) 500-125 MG tablet Take 1 tablet by mouth every 8 (eight) hours. 03/11/24  Yes Shrinika Blatz, Elsie, MD  atorvastatin  (LIPITOR) 80 MG tablet 1 tab by mouth daily with evening meal 06/20/23   Adella Norris, MD  cyclobenzaprine  (FLEXERIL ) 10 MG tablet Take 1 tablet (10 mg total) by mouth at bedtime. If needed for body aches 02/16/24   Rising, Asberry, PA-C  empagliflozin  (JARDIANCE ) 10 MG TABS tablet Take 1 tablet (10 mg total) by mouth daily before breakfast. 06/20/23   Adella Norris, MD  glipiZIDE  (GLUCOTROL ) 5 MG tablet TAKE 1 TABLET BY MOUTH TWICE DAILY WITH A MEAL 02/16/24   Adella Norris, MD  hydrochlorothiazide  (HYDRODIURIL ) 25 MG tablet Take 1 tablet (25 mg total) by mouth daily. 10/11/23   Adella Norris, MD  losartan  (COZAAR ) 50 MG tablet Take 2 tablets (100 mg total) by mouth daily. 02/10/23   Adella Norris, MD  metFORMIN  (GLUCOPHAGE ) 1000 MG tablet Take 1 tablet (1,000 mg total) by mouth 2 (two) times daily with a meal. 02/10/23   Adella Norris, MD  ondansetron  (ZOFRAN -ODT) 4 MG disintegrating tablet Take 1 tablet (4 mg total)  by mouth every 6 (six) hours as needed for nausea or vomiting. 02/16/24   Rising, Asberry, PA-C    Allergies: Lisinopril     Review of Systems  Updated Vital Signs BP (!) 138/98   Pulse 79   Temp 98.2 F (36.8 C)   Resp 17   Ht 6' 1 (1.854 m)   Wt 102.3 kg   SpO2 100%   BMI 29.75 kg/m   Physical Exam Constitutional:      Appearance: Normal appearance.  HENT:     Head: Normocephalic and atraumatic.     Right Ear: Ear canal and external ear normal.     Left Ear: Ear canal and external ear normal.     Nose: Nose normal.     Mouth/Throat:     Mouth: Mucous membranes are moist.     Pharynx: Oropharynx is clear.  Eyes:     Extraocular Movements: Extraocular movements intact.     Conjunctiva/sclera: Conjunctivae normal.     Pupils: Pupils are equal, round, and reactive to light.  Cardiovascular:     Rate and Rhythm: Normal rate and regular rhythm.     Pulses: Normal pulses.     Heart sounds: Normal heart sounds.  Pulmonary:     Effort: Pulmonary effort is normal.     Breath sounds: Normal breath sounds.  Abdominal:     General: Abdomen is flat.     Palpations: Abdomen is soft.  Musculoskeletal:        General: Normal range  of motion.     Cervical back: Normal range of motion.  Skin:    General: Skin is warm and dry.     Capillary Refill: Capillary refill takes less than 2 seconds.  Neurological:     General: No focal deficit present.     Mental Status: He is oriented to person, place, and time.     (all labs ordered are listed, but only abnormal results are displayed) Labs Reviewed  CBC WITH DIFFERENTIAL/PLATELET - Abnormal; Notable for the following components:      Result Value   WBC 12.2 (*)    Neutro Abs 9.3 (*)    All other components within normal limits  COMPREHENSIVE METABOLIC PANEL WITH GFR - Abnormal; Notable for the following components:   Sodium 134 (*)    Glucose, Bld 245 (*)    All other components within normal limits     EKG: None  Radiology: CT Head Wo Contrast Result Date: 03/11/2024 CLINICAL DATA:  Headache right facial swelling EXAM: CT HEAD WITHOUT CONTRAST TECHNIQUE: Contiguous axial images were obtained from the base of the skull through the vertex without intravenous contrast. RADIATION DOSE REDUCTION: This exam was performed according to the departmental dose-optimization program which includes automated exposure control, adjustment of the mA and/or kV according to patient size and/or use of iterative reconstruction technique. COMPARISON:  None Available. FINDINGS: Brain: No evidence of acute infarction, hemorrhage, hydrocephalus, extra-axial collection or mass lesion/mass effect. Vascular: No hyperdense vessel or unexpected calcification. Skull: Normal. Negative for fracture or focal lesion. Sinuses/Orbits: No acute finding. Other: None IMPRESSION: Negative non contrasted CT appearance of the brain. Electronically Signed   By: Luke Bun M.D.   On: 03/11/2024 15:03     Dental Block  Date/Time: 03/11/2024 4:55 PM  Performed by: Dorcus Fallow, MD Authorized by: Patt Alm Macho, MD   Consent:    Consent obtained:  Verbal   Consent given by:  Patient   Risks, benefits, and alternatives were discussed: yes     Risks discussed:  Allergic reaction, hematoma, intravascular injection, infection, pain, nerve damage, swelling and unsuccessful block   Alternatives discussed:  Delayed treatment and no treatment Universal protocol:    Procedure explained and questions answered to patient or proxy's satisfaction: yes     Relevant documents present and verified: yes     Test results available: yes     Imaging studies available: yes     Required blood products, implants, devices, and special equipment available: yes     Site/side marked: yes     Immediately prior to procedure, a time out was called: yes     Patient identity confirmed:  Verbally with patient and arm band Indications:    Indications:  dental pain and dry socket   Location:    Anesthesia block type: Infraorbital approach. Procedure details:    Syringe type:  Controlled syringe   Needle gauge:  25 G   Anesthetic injected:  Lidocaine 1% w/o epi   Injection procedure:  Anatomic landmarks identified, introduced needle, anatomic landmarks palpated, incremental injection and negative aspiration for blood Post-procedure details:    Outcome:  Pain improved   Procedure completion:  Tolerated well, no immediate complications    Medications Ordered in the ED  lidocaine (XYLOCAINE) 2 % (with pres) injection 100 mg (100 mg Infiltration Given 03/11/24 1643)  ketorolac  (TORADOL ) 30 MG/ML injection 15 mg (15 mg Intramuscular Given 03/11/24 1643)  amoxicillin-clavulanate (AUGMENTIN) 875-125 MG per tablet 1 tablet (1 tablet Oral  Given 03/11/24 1643)                                    Medical Decision Making Risk Prescription drug management.   48 year old male with PMH of dental caries presenting to the ED with right sided maxillary discomfort with associated tenderness to palpation overlying right maxillary molars, described as dull/aching pain, worst with meals, with associated perception of right-sided facial swelling.  No systemic symptoms including fevers, chills, nausea, vomiting.  Upon arrival, patient hemodynamically stable no acute distress.  Normotensive.  Afebrile.  No tachycardia or tachypnea.  Saturating 100% on RA.  No vesicles or lesions appreciated in the oral mucosa or on ear examination.  No angioedema.  No associated stigmata of acute allergic reaction/anaphylaxis.  Dry socket appreciated.  CT head ordered by triage without evidence of acute intracranial abnormality.  CN II through XII intact.  No facial droop/asymmetry with symmetric nasolabial folds bilaterally.  Patient given 15 mg IM Toradol  and infraorbital nerve block performed.  Ambulatory referral to dentistry provided in AVS.  At this time, patient is  hemodynamically stable and appropriate for discharge.  Strict return precautions discussed.  Final diagnoses:  Pain due to dental caries    ED Discharge Orders          Ordered    amoxicillin-clavulanate (AUGMENTIN) 500-125 MG tablet  Every 8 hours        03/11/24 1634               Ayris Carano, Elsie, MD 03/11/24 1657    Patt Alm Macho, MD 03/12/24 2102

## 2024-03-11 NOTE — ED Triage Notes (Addendum)
 Pt came in via POV d/t feeling like he began having allergic reaction when his face began swelling along with his gums, also having chills/shakes, endorsing the roof of his mouth feels like it is laying on my tongue, & it feels hard to swallow. States all the inside of his mouth s/s started 2 days ago & the outside s/s he has of his mouth started 30-40 minutes ago (per pt). Pt states that he had a reaction to taking lisinopril  in the past & he had a reaction similar to this, Pt does not know what could have caused the reaction this time.

## 2024-03-11 NOTE — ED Triage Notes (Signed)
 Pt states he thinks he is having an allergic reaction, states face is swelling, throat feels like it is closing, mouth and tongue feel itchy. Pts visitor said he had similar episode a few years ago with a BP medication allergy. Pt says swelling has been last 2 days but throat closing feeling in last 30 minutes. Pt denies any new medications or food out of the ordinary. Pt protecting airway and able to provide information at check in.

## 2024-03-11 NOTE — ED Provider Triage Note (Signed)
 Emergency Medicine Provider Triage Evaluation Note  Adam Moyer , a 48 y.o. male  was evaluated in triage.  Patient with history of diabetes, hypertension, hyperlipidemia.  Pt complains of right-sided facial swelling, sore throat.  Symptoms started over the past couple of days.  He reports difficulty swallowing due to pain.  He also developed a headache, left-sided/unilateral starting this morning, gradually worsening.  Due to worsening symptoms, he decided to come in to get checked.  He did notice some facial swelling.  Reports previous history of allergic reaction to lisinopril .  Review of Systems  Positive: Facial swelling, sore throat Negative: Extremity weakness  Physical Exam  BP 138/88   Pulse 93   Temp 98.2 F (36.8 C)   Resp 18   Ht 6' 1 (1.854 m)   Wt 102.3 kg   SpO2 98%   BMI 29.75 kg/m  Gen:   Awake, no distress   Resp:  Normal effort  MSK:   Moves extremities without difficulty  Other:  Patient with several broken teeth in the right maxillary jaw, question of mild facial swelling,?  Dental abscess, oropharynx appears clear, normal range of motion of neck without signs of meningismus.  Medical Decision Making  Medically screening exam initiated at 1:47 PM.  Appropriate orders placed.  Adam Moyer was informed that the remainder of the evaluation will be completed by another provider, this initial triage assessment does not replace that evaluation, and the importance of remaining in the ED until their evaluation is complete.  Do not suspect anaphylactic reaction.  Question of dental infection however this would not explain sore throat or difficulty swallowing.  May trigger a headache, however patients are atypical.  Given reported swallowing difficulty, CT head ordered.  Patient looks well.   Adam Chew, PA-C 03/11/24 1351

## 2024-03-20 ENCOUNTER — Other Ambulatory Visit: Payer: Self-pay | Admitting: Internal Medicine

## 2024-04-05 ENCOUNTER — Other Ambulatory Visit: Payer: Self-pay

## 2024-04-10 ENCOUNTER — Ambulatory Visit: Payer: Self-pay | Admitting: Internal Medicine

## 2024-04-12 ENCOUNTER — Other Ambulatory Visit: Payer: Self-pay

## 2024-04-18 ENCOUNTER — Other Ambulatory Visit: Payer: Self-pay | Admitting: Internal Medicine

## 2024-04-18 ENCOUNTER — Ambulatory Visit (INDEPENDENT_AMBULATORY_CARE_PROVIDER_SITE_OTHER): Payer: Self-pay | Admitting: Internal Medicine

## 2024-04-18 VITALS — BP 146/90 | HR 92 | Resp 20 | Ht 71.5 in | Wt 222.0 lb

## 2024-04-18 DIAGNOSIS — I10 Essential (primary) hypertension: Secondary | ICD-10-CM

## 2024-04-18 DIAGNOSIS — F32A Depression, unspecified: Secondary | ICD-10-CM

## 2024-04-18 DIAGNOSIS — F419 Anxiety disorder, unspecified: Secondary | ICD-10-CM

## 2024-04-18 DIAGNOSIS — E1142 Type 2 diabetes mellitus with diabetic polyneuropathy: Secondary | ICD-10-CM

## 2024-04-18 DIAGNOSIS — E782 Mixed hyperlipidemia: Secondary | ICD-10-CM

## 2024-04-18 MED ORDER — LOSARTAN POTASSIUM-HCTZ 100-25 MG PO TABS: 1.0000 | ORAL_TABLET | Freq: Every day | ORAL | 11 refills | Status: AC

## 2024-04-18 MED ORDER — AMLODIPINE BESYLATE 10 MG PO TABS: 10.0000 mg | ORAL_TABLET | Freq: Every day | ORAL | 11 refills | Status: AC

## 2024-04-18 MED ORDER — ATORVASTATIN CALCIUM 80 MG PO TABS: ORAL_TABLET | ORAL | 11 refills | Status: AC

## 2024-04-18 MED ORDER — GLIPIZIDE 10 MG PO TABS: 10.0000 mg | ORAL_TABLET | Freq: Two times a day (BID) | ORAL | 11 refills | Status: AC

## 2024-04-18 MED ORDER — METFORMIN HCL 1000 MG PO TABS: 1000.0000 mg | ORAL_TABLET | Freq: Two times a day (BID) | ORAL | 11 refills | Status: AC

## 2024-04-18 NOTE — Progress Notes (Signed)
 "   Subjective:    Patient ID: Adam Moyer, male   DOB: [birth year], 48 y.o.   MRN: [id number]   HPI   Depression and Anxiety for about 2 years.  Would like counseling.  Difficulties mainly with being able to talk with his wife.  Feels like she always finds fault with anything he does.  Has been an issue for 10 years.Does not feel his care for her is reciprocated.  2.  DM:  not checking sugars very often.  Sugar was above 200 in ED in September.  Last A1C just above 7.0% in January and was unable to apply for [employer]   as he made too much money.    3.  Hypertension:  reportedly taking meds.  4.  Declined all recommended vaccines  Current Meds  Medication Sig   amLODipine  (NORVASC ) 5 MG tablet Take 1 tablet by mouth once daily   atorvastatin  (LIPITOR) 80 MG tablet 1 tab by mouth daily with evening meal   glipiZIDE  (GLUCOTROL ) 5 MG tablet TAKE 1 TABLET BY MOUTH TWICE DAILY WITH A MEAL   hydrochlorothiazide  (HYDRODIURIL ) 25 MG tablet Take 1 tablet (25 mg total) by mouth daily.   losartan  (COZAAR ) 50 MG tablet Take 2 tablets (100 mg total) by mouth daily.   metFORMIN  (GLUCOPHAGE ) 1000 MG tablet TAKE 1 TABLET BY MOUTH TWICE DAILY WITH A MEAL   Allergies  Allergen Reactions   Lisinopril  Swelling     Review of Systems    Objective:   BP (!) 146/90 (BP Location: Left Arm, Cuff Size: Normal)   Pulse 92   Resp 20   Ht 5' 11.5 (1.816 m)   Wt 222 lb (100.7 kg)   BMI 30.53 kg/m   Physical Exam HENT:     Right Ear: Tympanic membrane normal.     Left Ear: Tympanic membrane normal.     Mouth/Throat:     Mouth: Mucous membranes are moist.     Pharynx: Oropharynx is clear.  Eyes:     Extraocular Movements: Extraocular movements intact.     Conjunctiva/sclera: Conjunctivae normal.     Pupils: Pupils are equal, round, and reactive to light.  Neck:     Thyroid: No thyroid mass or thyromegaly.  Cardiovascular:     Rate and Rhythm: Normal rate and regular rhythm.     Heart  sounds: S1 normal and S2 normal. No murmur heard.    No S3 or S4 sounds.     Comments: No carotid bruits.  Carotid, radial, femoral, DP and PT pulses normal and equal.   Pulmonary:     Effort: Pulmonary effort is normal.     Breath sounds: Normal breath sounds and air entry.  Abdominal:     General: Bowel sounds are normal.     Palpations: Abdomen is soft. There is no hepatomegaly, splenomegaly or mass.     Tenderness: There is no abdominal tenderness.     Hernia: No hernia is present.  Musculoskeletal:     Cervical back: Normal range of motion and neck supple.     Right lower leg: No edema.     Left lower leg: No edema.  Lymphadenopathy:     Head:     Right side of head: No submental or submandibular adenopathy.     Left side of head: No submental or submandibular adenopathy.     Cervical: No cervical adenopathy.     Upper Body:     Right upper body: No supraclavicular adenopathy.  Left upper body: No supraclavicular adenopathy.  Skin:    General: Skin is warm.     Findings: No rash.  Neurological:     Mental Status: He is alert.      Assessment & Plan   Depression/marital issues:  Referral to [facility], [facility], discuss at conference.    2.  DM:  Return for A1C, urine microalbumin/crea.  Increase glipizide  to 10 mg twice daily.  Recommend he get eye exam yearly.  He apparently does not meet criteria for orange card.  3.  Hypertension:  not clear taking meds regularly.  Change to combined Losartan /hydrochlorothiazide  for cost and ease of use.  Return for CBC, CMP  4.  Hyperlipidemia:  FLP in next 2 weeks with fasting labs.  5.  HM:  declines all recommended vaccines.  PSA with fasting labs "

## 2024-05-01 ENCOUNTER — Other Ambulatory Visit: Payer: Self-pay | Admitting: Internal Medicine

## 2024-05-03 ENCOUNTER — Other Ambulatory Visit: Payer: Self-pay

## 2024-05-07 ENCOUNTER — Telehealth: Payer: Self-pay | Admitting: Psychology

## 2024-06-24 ENCOUNTER — Other Ambulatory Visit: Payer: Self-pay | Admitting: Internal Medicine

## 2024-06-25 ENCOUNTER — Encounter: Payer: Self-pay | Admitting: Emergency Medicine

## 2024-06-25 ENCOUNTER — Ambulatory Visit
Admission: EM | Admit: 2024-06-25 | Discharge: 2024-06-25 | Disposition: A | Discharge status: Home / Self Care | Payer: Self-pay

## 2024-06-25 DIAGNOSIS — R112 Nausea with vomiting, unspecified: Secondary | ICD-10-CM

## 2024-06-25 DIAGNOSIS — A084 Viral intestinal infection, unspecified: Secondary | ICD-10-CM

## 2024-06-25 DIAGNOSIS — R197 Diarrhea, unspecified: Secondary | ICD-10-CM

## 2024-06-25 LAB — POC SOFIA SARS ANTIGEN FIA: SARS Coronavirus 2 Ag: NEGATIVE

## 2024-06-25 MED ORDER — ONDANSETRON 4 MG PO TBDP
4.0000 mg | ORAL_TABLET | Freq: Once | ORAL | Status: AC
  Administered 2024-06-25: 4 mg via ORAL

## 2024-06-25 MED ORDER — ONDANSETRON 4 MG PO TBDP: 4.0000 mg | ORAL_TABLET | Freq: Three times a day (TID) | ORAL | 0 refills | Status: AC | PRN

## 2024-06-25 NOTE — ED Triage Notes (Signed)
 Pt presents c/o URI x 3 days. Pt states,  The last 2 to 3 days I been real fatigued. Headache, throat hurt, body aches, and hot flashes. I been vomiting and had diarrhea too.  Pt denies cough, sneeze, congestion.

## 2024-06-25 NOTE — Discharge Instructions (Addendum)

## 2024-06-25 NOTE — ED Provider Notes (Signed)
 " [facility]    CSN: [id number] Arrival date & time: 06/25/24  9073      History   Chief Complaint Chief Complaint  Patient presents with   Fatigue   Fever   Headache   Diarrhea   Emesis    HPI Adam Moyer is a 49 y.o. male.   Adam Moyer is a 49 y.o. male presenting for chief complaint of Fatigue, Fever, Headache, Diarrhea, and Emesis that started 2-3 days ago. He has had multiple episodes of vomiting and diarrhea in the last 24 hours. No blood in output. Last episode of vomiting was this morning. He has not had anything to eat or drink after last episode of emesis. Eating bland foods and drinking Pedialyte without relief.  Complains of generalized abdominal discomfort.  Denies hematochezia, melena, hematemesis, fever, chills, cough, and nasal congestion.  Multiple sick contacts at work with similar symptoms.  Denies recent travel, intake of foods outside of normal diet, dizziness, rashes, and recent antibiotic or steroid use.  History of type 2 diabetes.  Last hemoglobin A1c on file was from 1 year ago and was 7.6.  Diabetes is well-controlled with metformin  and glipizide .    Fever Associated symptoms: diarrhea, headaches and vomiting   Headache Associated symptoms: diarrhea, fever and vomiting   Diarrhea Associated symptoms: fever, headaches and vomiting   Emesis Associated symptoms: diarrhea, fever and headaches     Past Medical History:  Diagnosis Date   Diabetes mellitus 2010   Hyperlipidemia 2017   Hypertension 2010    Patient Active Problem List   Diagnosis Date Noted   Tinea 05/10/2021   DM type 2 with diabetic peripheral neuropathy (HCC) 06/13/2008   Primary hypertension 06/13/2008   Hyperlipidemia 06/13/2008    History reviewed. No pertinent surgical history.     Home Medications    Prior to Admission medications  Medication Sig Start Date End Date Taking? Authorizing Provider  amLODipine  (NORVASC ) 10 MG tablet Take 1 tablet (10  mg total) by mouth daily. 04/18/24  Yes Adella Norris, MD  amLODipine  (NORVASC ) 5 MG tablet Take 5 mg by mouth daily. 06/05/24  Yes [provider]  glipiZIDE  (GLUCOTROL ) 5 MG tablet Take 5 mg by mouth 2 (two) times daily. 06/13/24  Yes [provider]  ibuprofen (ADVIL) 400 MG tablet Take 400 mg by mouth every 6 (six) hours as needed.   Yes [provider]  losartan  (COZAAR ) 50 MG tablet Take 1 tablet by mouth daily. 04/22/18  Yes [provider]  metFORMIN  (GLUCOPHAGE ) 1000 MG tablet Take 1 tablet (1,000 mg total) by mouth 2 (two) times daily with a meal. 04/18/24  Yes Adella Norris, MD  naproxen (NAPROSYN) 500 MG tablet Take 500 mg by mouth. 02/15/18  Yes [provider]  ondansetron  (ZOFRAN -ODT) 4 MG disintegrating tablet Take 1 tablet (4 mg total) by mouth every 8 (eight) hours as needed for nausea or vomiting. 06/25/24  Yes Cariah Salatino, Dorna HERO, FNP  predniSONE  (STERAPRED UNI-PAK 48 TAB) 10 MG (48) TBPK tablet Take by mouth. 02/15/18  Yes [provider]  atorvastatin  (LIPITOR) 80 MG tablet 1 tab by mouth daily with evening meal 04/18/24   Adella Norris, MD  glipiZIDE  (GLUCOTROL ) 10 MG tablet Take 1 tablet (10 mg total) by mouth 2 (two) times daily before a meal. 04/18/24   Adella Norris, MD  losartan -hydrochlorothiazide  (HYZAAR) 100-25 MG tablet Take 1 tablet by mouth daily. 04/18/24   Adella Norris, MD    Family History Family  History  Problem Relation Age of Onset   Diabetes Mother    Hypertension Mother    Hyperlipidemia Mother    Heart disease Father        MI   Diabetes Sister    Hyperlipidemia Sister    Hypertension Sister    Hypertension Brother    Hyperlipidemia Brother    Diabetes Brother     Social History Social History[1]   Allergies   Lisinopril    Review of Systems Review of Systems  Constitutional:  Positive for fever.  Gastrointestinal:  Positive for diarrhea and vomiting.   Neurological:  Positive for headaches.  Per HPI   Physical Exam Triage Vital Signs ED Triage Vitals  Encounter Vitals Group     BP 06/25/24 1031 117/82     Girls Systolic BP Percentile --      Girls Diastolic BP Percentile --      Boys Systolic BP Percentile --      Boys Diastolic BP Percentile --      Pulse Rate 06/25/24 1031 83     Resp 06/25/24 1031 18     Temp 06/25/24 1031 98.1 F (36.7 C)     Temp Source 06/25/24 1031 Oral     SpO2 06/25/24 1031 98 %     Weight 06/25/24 1028 222 lb 0.1 oz (100.7 kg)     Height --      Head Circumference --      Peak Flow --      Pain Score 06/25/24 1028 8     Pain Loc --      Pain Education --      Exclude from Growth Chart --    No data found.  Updated Vital Signs BP 117/82 (BP Location: Left Arm)   Pulse 83   Temp 98.1 F (36.7 C) (Oral)   Resp 18   Wt 222 lb 0.1 oz (100.7 kg)   SpO2 98%   BMI 30.53 kg/m   Visual Acuity Right Eye Distance:   Left Eye Distance:   Bilateral Distance:    Right Eye Near:   Left Eye Near:    Bilateral Near:     Physical Exam Vitals and nursing note reviewed.  Constitutional:      Appearance: He is not ill-appearing or toxic-appearing.  HENT:     Head: Normocephalic and atraumatic.     Right Ear: Hearing and external ear normal.     Left Ear: Hearing and external ear normal.     Nose: Nose normal.     Mouth/Throat:     Lips: Pink.  Eyes:     General: Lids are normal. Vision grossly intact. Gaze aligned appropriately.     Extraocular Movements: Extraocular movements intact.     Conjunctiva/sclera: Conjunctivae normal.  Pulmonary:     Effort: Pulmonary effort is normal.  Abdominal:     General: Bowel sounds are normal.     Palpations: Abdomen is soft.     Tenderness: There is no abdominal tenderness. There is no right CVA tenderness, left CVA tenderness or guarding.  Musculoskeletal:     Cervical back: Neck supple.  Skin:    General: Skin is warm and dry.     Capillary  Refill: Capillary refill takes less than 2 seconds.     Findings: No rash.  Neurological:     General: No focal deficit present.     Mental Status: He is alert and oriented to person, place, and time. Mental status is  at baseline.     Cranial Nerves: No dysarthria or facial asymmetry.  Psychiatric:        Mood and Affect: Mood normal.        Speech: Speech normal.        Behavior: Behavior normal.        Thought Content: Thought content normal.        Judgment: Judgment normal.      UC Treatments / Results  Labs (all labs ordered are listed, but only abnormal results are displayed) Labs Reviewed  POC SOFIA SARS ANTIGEN FIA - Normal    EKG   Radiology No results found.  Procedures Procedures (including critical care time)  Medications Ordered in UC Medications  ondansetron  (ZOFRAN -ODT) disintegrating tablet 4 mg (has no administration in time range)    Initial Impression / Assessment and Plan / UC Course  I have reviewed the triage vital signs and the nursing notes.  Pertinent labs & imaging results that were available during my care of the patient were reviewed by me and considered in my medical decision making (see chart for details).   1.  Nausea vomiting and diarrhea, viral gastroenteritis Evaluation suggests viral gastrointestinal illness etiology.  Patient nontoxic appearing with hemodynamically stable vital signs, abdominal exam without peritoneal signs/focal tenderness. Will manage this with antiemetic (Zofran ) as needed, OTC medicines as needed for discomfort/pain, increased fluids, and rest. Zofran  given in clinic.  Liquid/bland diet initially, then increase diet as tolerated.   Counseled patient on potential for adverse effects with medications prescribed/recommended today, strict ER and return-to-clinic precautions discussed, patient verbalized understanding.    Final Clinical Impressions(s) / UC Diagnoses   Final diagnoses:  Nausea vomiting and  diarrhea  Viral gastroenteritis     Discharge Instructions      Your evaluation suggests that your symptoms are most likely due to viral stomach illness (gastroenteritis/stomach bug) which will improve on its own with rest and fluids in the next few days.   Take zofran  to help with nausea every 8 hours as needed. You may use over the counter medicines for aches and pains such as tylenol  as needed.  Start sipping on liquids (broth, water, gatorade, etc). If you are able to keep liquids down without vomiting for 1-2 hours, you may eat bland foods like jello, pudding, applesauce, bananas, rice, and white toast. Once you can tolerate blands, you may return to normal diet.   Pedialyte or gatorolyte may help to prevent/fix dehydration due to vomiting and diarrhea.  Please follow up with your primary care provider for further management. Return if you experience worsening or uncontrolled pain, inability to tolerate fluids by mouth, difficulty breathing, fevers 100.45F or greater, recurrent vomiting, or any other concerning symptoms.   ED Prescriptions     Medication Sig Dispense Auth. Provider   ondansetron  (ZOFRAN -ODT) 4 MG disintegrating tablet Take 1 tablet (4 mg total) by mouth every 8 (eight) hours as needed for nausea or vomiting. 20 tablet Enedelia Dorna HERO, FNP      PDMP not reviewed this encounter.    [1]  Social History Tobacco Use   Smoking status: Former    Current packs/day: 0.00    Average packs/day: 1 pack/day for 17.0 years (17.0 ttl pk-yrs)    Types: Cigarettes    Start date: 10/12/1998    Quit date: 10/12/2015    Years since quitting: 8.7    Passive exposure: Past   Smokeless tobacco: Never  Vaping Use   Vaping status: Never  Used  Substance Use Topics   Alcohol use: Yes    Comment: occasional   Drug use: Yes    Types: Marijuana    Comment: every other day     Enedelia Dorna HERO, FNP 06/25/24 1124  "

## 2024-06-28 ENCOUNTER — Encounter: Payer: Self-pay | Admitting: Internal Medicine

## 2024-07-12 ENCOUNTER — Other Ambulatory Visit: Payer: Self-pay | Admitting: Internal Medicine

## 2024-11-12 ENCOUNTER — Encounter: Payer: Self-pay | Admitting: Internal Medicine
